# Patient Record
Sex: Male | Born: 1945 | Race: White | Hispanic: No | Marital: Married | Smoking: Never smoker
Health system: Southern US, Community
[De-identification: ages and names within clinical notes are randomized; demographics above are authoritative.]

## PROBLEM LIST (undated history)

## (undated) DIAGNOSIS — C801 Malignant (primary) neoplasm, unspecified: Secondary | ICD-10-CM

## (undated) DIAGNOSIS — M1990 Unspecified osteoarthritis, unspecified site: Secondary | ICD-10-CM

## (undated) DIAGNOSIS — E039 Hypothyroidism, unspecified: Secondary | ICD-10-CM

## (undated) DIAGNOSIS — J45909 Unspecified asthma, uncomplicated: Secondary | ICD-10-CM

## (undated) DIAGNOSIS — C61 Malignant neoplasm of prostate: Secondary | ICD-10-CM

## (undated) DIAGNOSIS — C4490 Unspecified malignant neoplasm of skin, unspecified: Secondary | ICD-10-CM

## (undated) HISTORY — PX: NASAL SEPTUM SURGERY: SHX37

## (undated) HISTORY — DX: Unspecified osteoarthritis, unspecified site: M19.90

## (undated) HISTORY — DX: Unspecified malignant neoplasm of skin, unspecified: C44.90

## (undated) HISTORY — PX: OTHER SURGICAL HISTORY: SHX169

## (undated) HISTORY — DX: Malignant neoplasm of prostate: C61

---

## 1998-11-29 ENCOUNTER — Ambulatory Visit (HOSPITAL_BASED_OUTPATIENT_CLINIC_OR_DEPARTMENT_OTHER): Admission: RE | Admit: 1998-11-29 | Discharge: 1998-11-29 | Payer: Self-pay | Admitting: General Surgery

## 2007-12-16 DIAGNOSIS — C61 Malignant neoplasm of prostate: Secondary | ICD-10-CM

## 2007-12-16 HISTORY — PX: XI ROBOTIC ASSISTED SIMPLE PROSTATECTOMY: SHX6713

## 2007-12-16 HISTORY — DX: Malignant neoplasm of prostate: C61

## 2008-09-28 ENCOUNTER — Inpatient Hospital Stay (HOSPITAL_COMMUNITY): Admission: RE | Admit: 2008-09-28 | Discharge: 2008-09-29 | Payer: Self-pay | Admitting: Urology

## 2008-09-28 ENCOUNTER — Encounter (INDEPENDENT_AMBULATORY_CARE_PROVIDER_SITE_OTHER): Payer: Self-pay | Admitting: Urology

## 2009-06-20 ENCOUNTER — Ambulatory Visit: Payer: Self-pay | Admitting: Oncology

## 2009-06-28 LAB — COMPREHENSIVE METABOLIC PANEL
ALT: 15 U/L (ref 0–53)
CO2: 28 mEq/L (ref 19–32)
Calcium: 9.4 mg/dL (ref 8.4–10.5)
Chloride: 101 mEq/L (ref 96–112)
Creatinine, Ser: 1.19 mg/dL (ref 0.40–1.50)
Glucose, Bld: 91 mg/dL (ref 70–99)
Total Bilirubin: 0.7 mg/dL (ref 0.3–1.2)

## 2009-06-28 LAB — CBC WITH DIFFERENTIAL/PLATELET
BASO%: 0.2 % (ref 0.0–2.0)
EOS%: 5.4 % (ref 0.0–7.0)
Eosinophils Absolute: 0.4 10*3/uL (ref 0.0–0.5)
LYMPH%: 17.9 % (ref 14.0–49.0)
MCH: 32 pg (ref 27.2–33.4)
MCHC: 34.8 g/dL (ref 32.0–36.0)
MCV: 92.2 fL (ref 79.3–98.0)
MONO%: 6.6 % (ref 0.0–14.0)
NEUT#: 5.2 10*3/uL (ref 1.5–6.5)
Platelets: 271 10*3/uL (ref 140–400)
RBC: 4.41 10*6/uL (ref 4.20–5.82)
RDW: 12.9 % (ref 11.0–14.6)

## 2009-06-28 LAB — LACTATE DEHYDROGENASE: LDH: 130 U/L (ref 94–250)

## 2009-06-28 LAB — URIC ACID: Uric Acid, Serum: 6.3 mg/dL (ref 4.0–7.8)

## 2009-06-28 LAB — MORPHOLOGY: RBC Comments: NORMAL

## 2009-07-04 ENCOUNTER — Ambulatory Visit (HOSPITAL_COMMUNITY): Admission: RE | Admit: 2009-07-04 | Discharge: 2009-07-04 | Payer: Self-pay | Admitting: Oncology

## 2009-07-11 ENCOUNTER — Ambulatory Visit: Admission: RE | Admit: 2009-07-11 | Discharge: 2009-08-15 | Payer: Self-pay | Admitting: Radiation Oncology

## 2009-08-02 ENCOUNTER — Ambulatory Visit: Payer: Self-pay | Admitting: Oncology

## 2009-09-28 ENCOUNTER — Ambulatory Visit: Payer: Self-pay | Admitting: Oncology

## 2009-10-27 IMAGING — CR DG CHEST 2V
2 series · 2 of 2 positions shown · non-contrast
Comparison: No priors

CLINICAL DATA: Preop for prostate cancer

CHEST - 2 VIEW

[w chest pa]
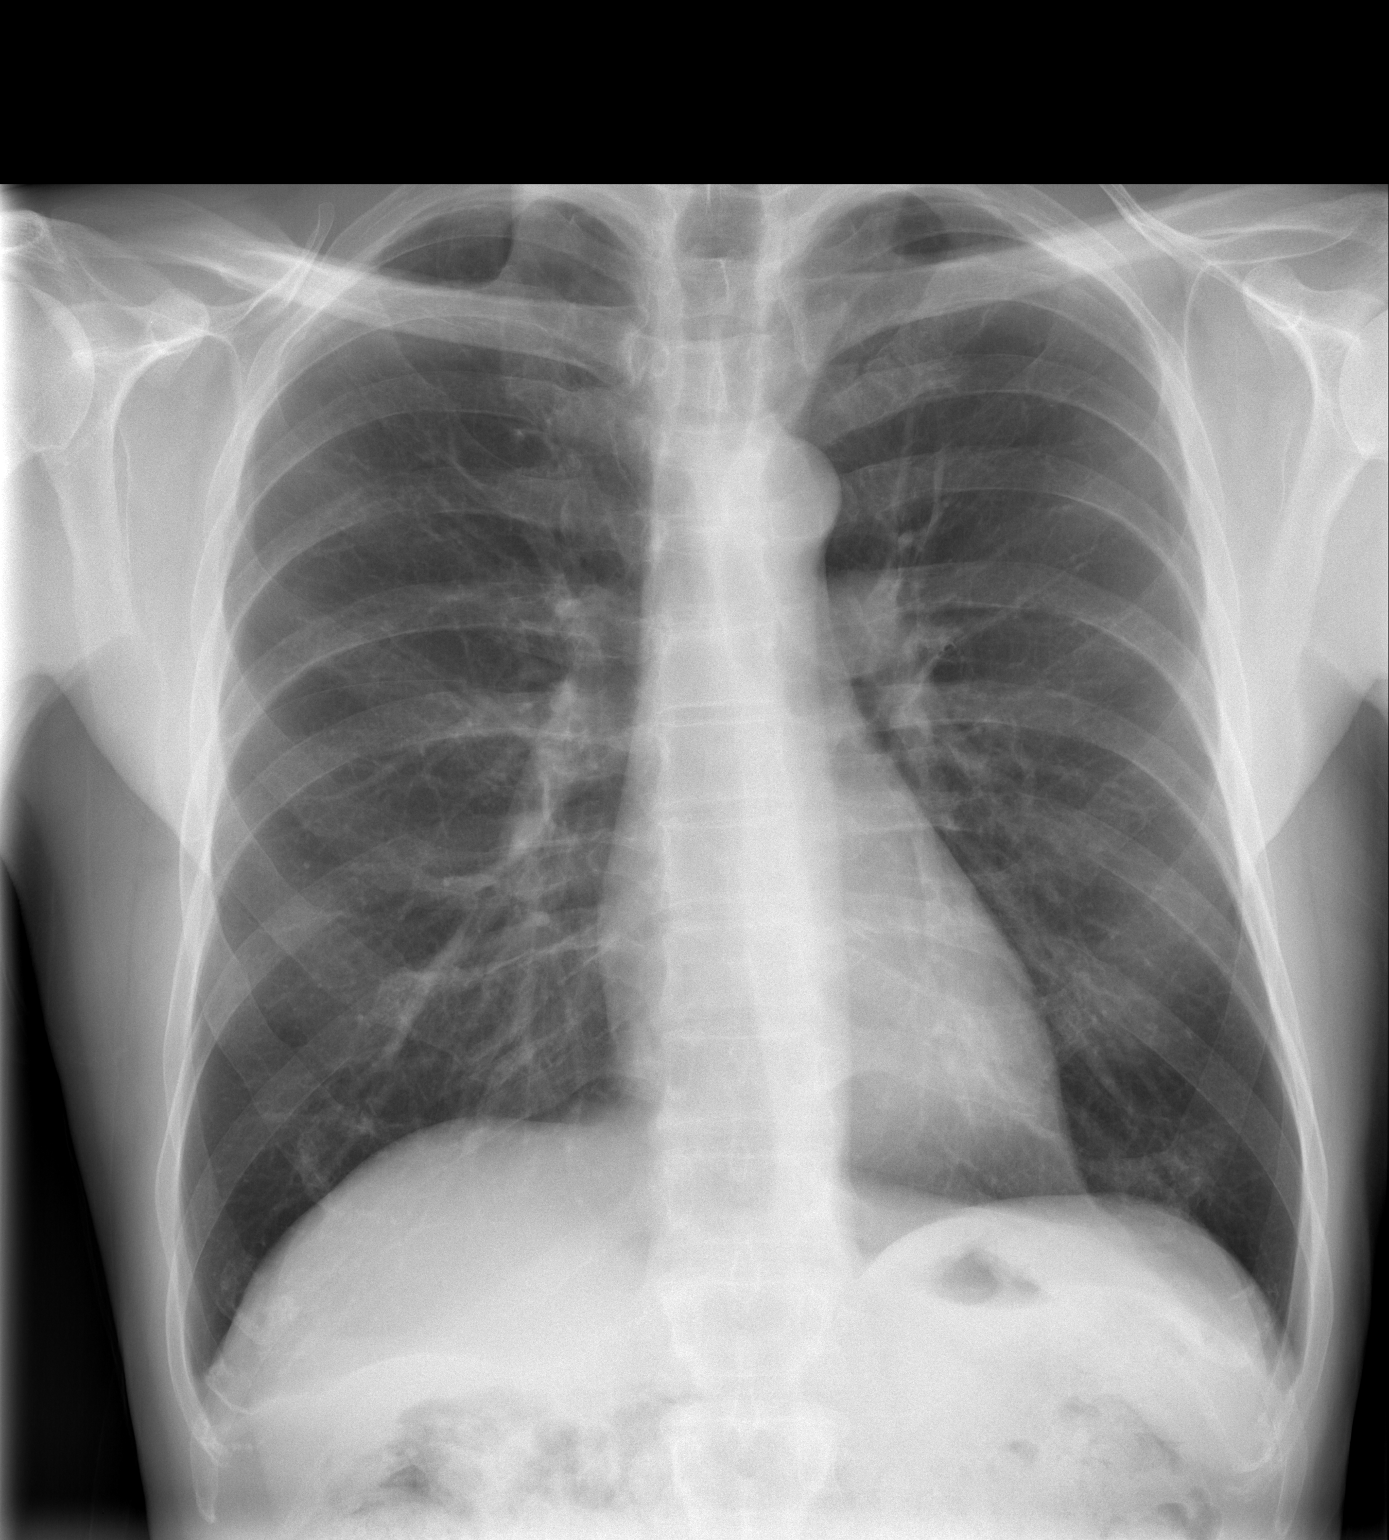

[w chest lat]
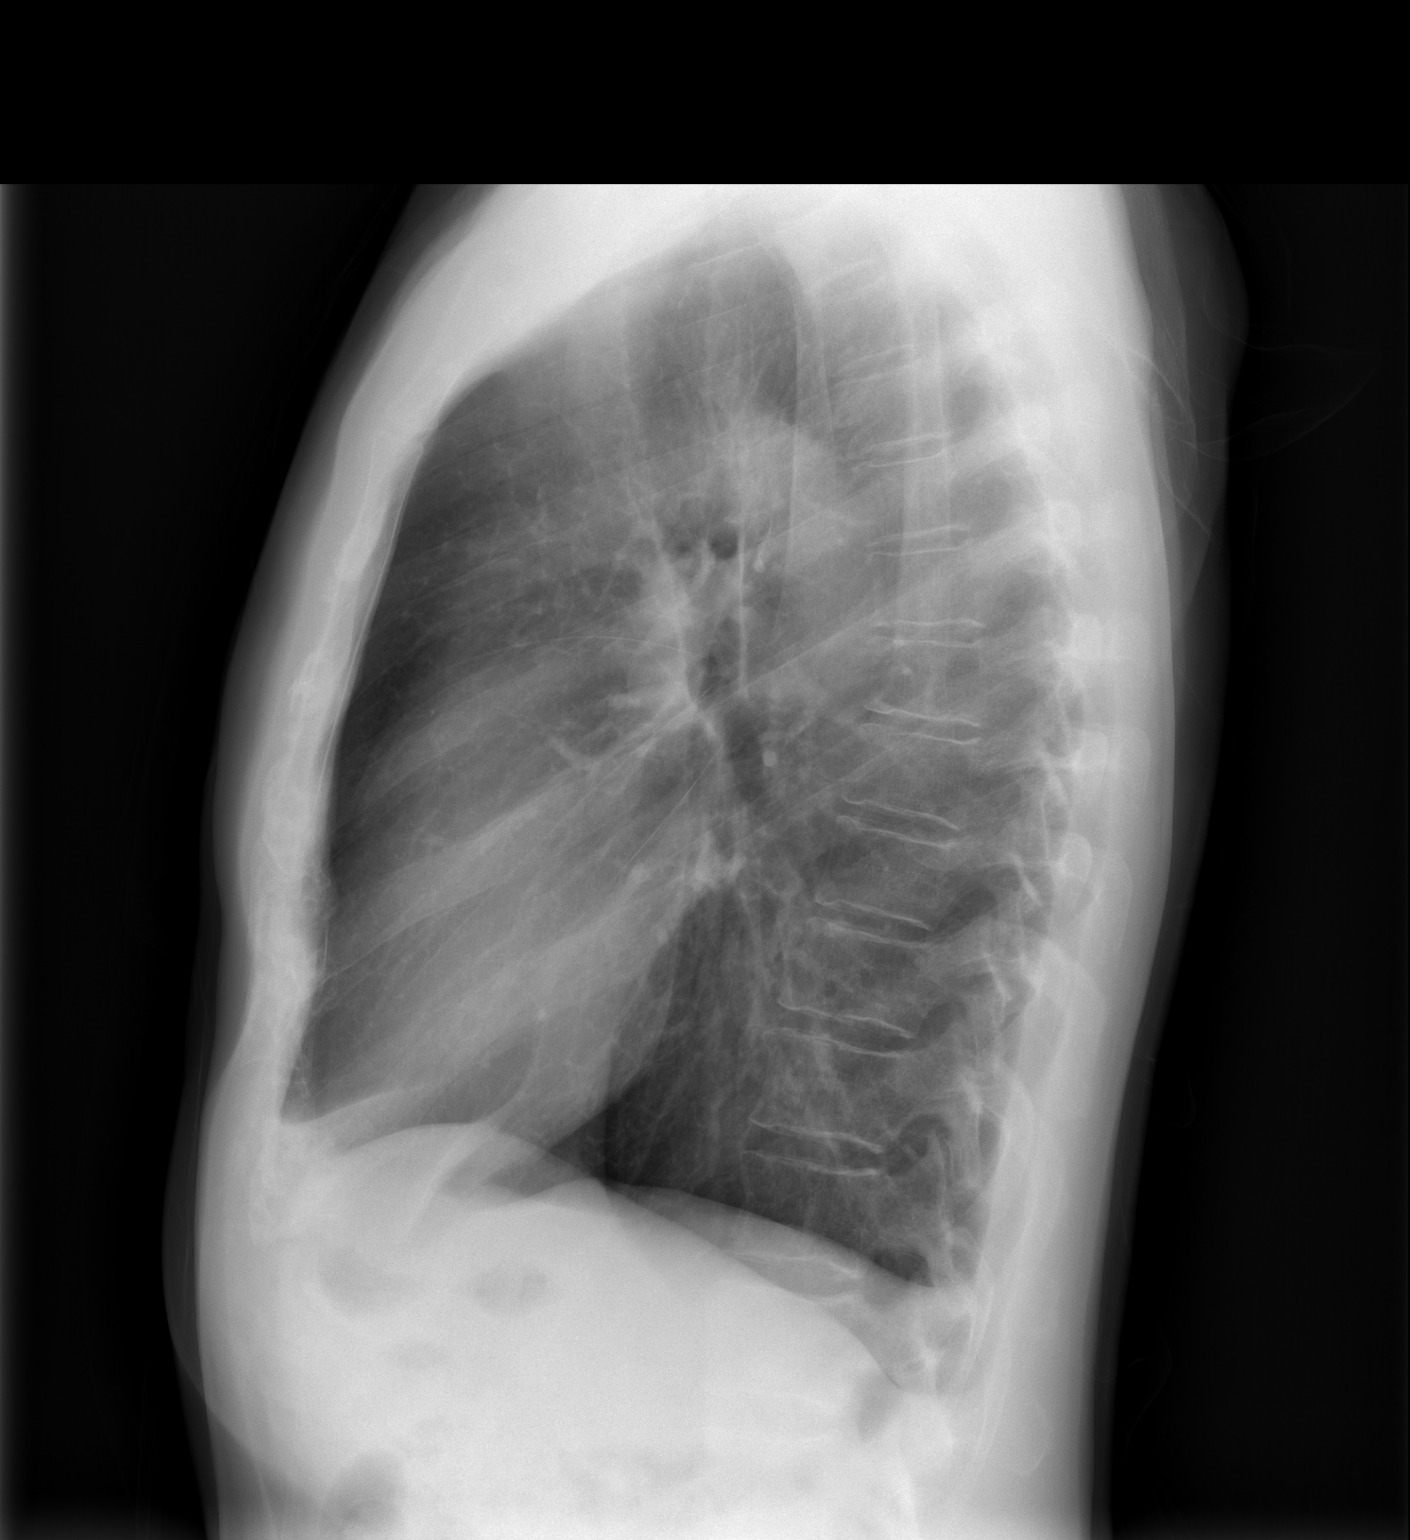

[2 of 2 positions shown; findings below may reference images not displayed]

FINDINGS: Heart and mediastinal contours normal.  Lungs
hyperaerated but clear.  This suggests early COPD.

Osseous structures intact.
IMPRESSION: Probable COPD - no active disease.

## 2010-03-29 ENCOUNTER — Ambulatory Visit: Payer: Self-pay | Admitting: Oncology

## 2010-04-02 LAB — COMPREHENSIVE METABOLIC PANEL
ALT: 14 U/L (ref 0–53)
AST: 22 U/L (ref 0–37)
Albumin: 4 g/dL (ref 3.5–5.2)
Alkaline Phosphatase: 51 U/L (ref 39–117)
BUN: 20 mg/dL (ref 6–23)
CO2: 23 mEq/L (ref 19–32)
Calcium: 8.8 mg/dL (ref 8.4–10.5)
Chloride: 99 mEq/L (ref 96–112)
Creatinine, Ser: 1.1 mg/dL (ref 0.40–1.50)
Glucose, Bld: 94 mg/dL (ref 70–99)
Potassium: 4.6 mEq/L (ref 3.5–5.3)
Sodium: 135 mEq/L (ref 135–145)
Total Bilirubin: 0.4 mg/dL (ref 0.3–1.2)
Total Protein: 6.7 g/dL (ref 6.0–8.3)

## 2010-04-02 LAB — CBC WITH DIFFERENTIAL/PLATELET
BASO%: 0.4 % (ref 0.0–2.0)
Basophils Absolute: 0 10*3/uL (ref 0.0–0.1)
EOS%: 10.9 % — ABNORMAL HIGH (ref 0.0–7.0)
Eosinophils Absolute: 0.7 10*3/uL — ABNORMAL HIGH (ref 0.0–0.5)
HCT: 41.1 % (ref 38.4–49.9)
HGB: 14.1 g/dL (ref 13.0–17.1)
LYMPH%: 21.5 % (ref 14.0–49.0)
MCH: 31.8 pg (ref 27.2–33.4)
MCHC: 34.4 g/dL (ref 32.0–36.0)
MCV: 92.3 fL (ref 79.3–98.0)
MONO#: 0.4 10*3/uL (ref 0.1–0.9)
MONO%: 7.1 % (ref 0.0–14.0)
NEUT#: 3.7 10*3/uL (ref 1.5–6.5)
NEUT%: 60.1 % (ref 39.0–75.0)
Platelets: 254 10*3/uL (ref 140–400)
RBC: 4.46 10*6/uL (ref 4.20–5.82)
RDW: 13.3 % (ref 11.0–14.6)
WBC: 6.2 10*3/uL (ref 4.0–10.3)
lymph#: 1.3 10*3/uL (ref 0.9–3.3)

## 2010-04-02 LAB — LACTATE DEHYDROGENASE: LDH: 136 U/L (ref 94–250)

## 2010-10-01 ENCOUNTER — Ambulatory Visit: Payer: Self-pay | Admitting: Oncology

## 2010-10-03 LAB — COMPREHENSIVE METABOLIC PANEL
ALT: 12 U/L (ref 0–53)
AST: 22 U/L (ref 0–37)
Albumin: 4 g/dL (ref 3.5–5.2)
Alkaline Phosphatase: 52 U/L (ref 39–117)
BUN: 18 mg/dL (ref 6–23)
CO2: 27 mEq/L (ref 19–32)
Calcium: 9.4 mg/dL (ref 8.4–10.5)
Chloride: 102 mEq/L (ref 96–112)
Creatinine, Ser: 1.05 mg/dL (ref 0.40–1.50)
Glucose, Bld: 78 mg/dL (ref 70–99)
Potassium: 4.7 mEq/L (ref 3.5–5.3)
Sodium: 139 mEq/L (ref 135–145)
Total Bilirubin: 0.4 mg/dL (ref 0.3–1.2)
Total Protein: 6.6 g/dL (ref 6.0–8.3)

## 2010-10-03 LAB — CBC WITH DIFFERENTIAL/PLATELET
BASO%: 0.9 % (ref 0.0–2.0)
Basophils Absolute: 0 10*3/uL (ref 0.0–0.1)
EOS%: 7.9 % — ABNORMAL HIGH (ref 0.0–7.0)
Eosinophils Absolute: 0.3 10*3/uL (ref 0.0–0.5)
HCT: 41.4 % (ref 38.4–49.9)
HGB: 14.4 g/dL (ref 13.0–17.1)
LYMPH%: 21.2 % (ref 14.0–49.0)
MCH: 32 pg (ref 27.2–33.4)
MCHC: 34.7 g/dL (ref 32.0–36.0)
MCV: 92.2 fL (ref 79.3–98.0)
MONO#: 0.4 10*3/uL (ref 0.1–0.9)
MONO%: 8.4 % (ref 0.0–14.0)
NEUT#: 2.7 10*3/uL (ref 1.5–6.5)
NEUT%: 61.6 % (ref 39.0–75.0)
Platelets: 245 10*3/uL (ref 140–400)
RBC: 4.49 10*6/uL (ref 4.20–5.82)
RDW: 13.3 % (ref 11.0–14.6)
WBC: 4.4 10*3/uL (ref 4.0–10.3)
lymph#: 0.9 10*3/uL (ref 0.9–3.3)

## 2010-10-03 LAB — LACTATE DEHYDROGENASE: LDH: 140 U/L (ref 94–250)

## 2011-01-04 ENCOUNTER — Other Ambulatory Visit (HOSPITAL_COMMUNITY): Payer: Self-pay | Admitting: Oncology

## 2011-01-04 DIAGNOSIS — C8590 Non-Hodgkin lymphoma, unspecified, unspecified site: Secondary | ICD-10-CM

## 2011-03-16 DIAGNOSIS — C4490 Unspecified malignant neoplasm of skin, unspecified: Secondary | ICD-10-CM

## 2011-03-16 HISTORY — DX: Unspecified malignant neoplasm of skin, unspecified: C44.90

## 2011-04-01 ENCOUNTER — Other Ambulatory Visit (HOSPITAL_COMMUNITY): Payer: Self-pay | Admitting: Oncology

## 2011-04-01 ENCOUNTER — Encounter (HOSPITAL_COMMUNITY): Payer: Self-pay

## 2011-04-01 ENCOUNTER — Encounter (HOSPITAL_BASED_OUTPATIENT_CLINIC_OR_DEPARTMENT_OTHER): Payer: BC Managed Care – PPO | Admitting: Oncology

## 2011-04-01 ENCOUNTER — Ambulatory Visit (HOSPITAL_COMMUNITY)
Admission: RE | Admit: 2011-04-01 | Discharge: 2011-04-01 | Disposition: A | Discharge status: Home / Self Care | Payer: BC Managed Care – PPO | Source: Ambulatory Visit | Attending: Oncology | Admitting: Oncology

## 2011-04-01 ENCOUNTER — Other Ambulatory Visit: Payer: Self-pay | Admitting: Oncology

## 2011-04-01 DIAGNOSIS — C8590 Non-Hodgkin lymphoma, unspecified, unspecified site: Secondary | ICD-10-CM

## 2011-04-01 DIAGNOSIS — C61 Malignant neoplasm of prostate: Secondary | ICD-10-CM

## 2011-04-01 DIAGNOSIS — C8589 Other specified types of non-Hodgkin lymphoma, extranodal and solid organ sites: Secondary | ICD-10-CM | POA: Insufficient documentation

## 2011-04-01 HISTORY — DX: Malignant (primary) neoplasm, unspecified: C80.1

## 2011-04-01 LAB — CBC WITH DIFFERENTIAL/PLATELET
Basophils Absolute: 0 10*3/uL (ref 0.0–0.1)
Eosinophils Absolute: 0.7 10*3/uL — ABNORMAL HIGH (ref 0.0–0.5)
LYMPH%: 26.3 % (ref 14.0–49.0)
MCH: 31.5 pg (ref 27.2–33.4)
MCV: 91.7 fL (ref 79.3–98.0)
MONO%: 8.2 % (ref 0.0–14.0)
NEUT#: 2.3 10*3/uL (ref 1.5–6.5)
Platelets: 260 10*3/uL (ref 140–400)
RBC: 4.67 10*6/uL (ref 4.20–5.82)

## 2011-04-01 LAB — CMP (CANCER CENTER ONLY)
ALT(SGPT): 19 U/L (ref 10–47)
AST: 28 U/L (ref 11–38)
Albumin: 3.6 g/dL (ref 3.3–5.5)
BUN, Bld: 16 mg/dL (ref 7–22)
Calcium: 9.1 mg/dL (ref 8.0–10.3)
Creat: 1.2 mg/dl (ref 0.6–1.2)
Glucose, Bld: 91 mg/dL (ref 73–118)
Sodium: 138 mEq/L (ref 128–145)
Total Protein: 7.1 g/dL (ref 6.4–8.1)

## 2011-04-01 LAB — LACTATE DEHYDROGENASE: LDH: 142 U/L (ref 94–250)

## 2011-04-01 MED ORDER — IOHEXOL 300 MG/ML  SOLN
100.0000 mL | Freq: Once | INTRAMUSCULAR | Status: AC | PRN
  Administered 2011-04-01: 100 mL via INTRAVENOUS

## 2011-04-04 ENCOUNTER — Encounter (HOSPITAL_BASED_OUTPATIENT_CLINIC_OR_DEPARTMENT_OTHER): Payer: BC Managed Care – PPO | Admitting: Oncology

## 2011-04-04 DIAGNOSIS — C8589 Other specified types of non-Hodgkin lymphoma, extranodal and solid organ sites: Secondary | ICD-10-CM

## 2011-04-04 DIAGNOSIS — C61 Malignant neoplasm of prostate: Secondary | ICD-10-CM

## 2011-04-29 NOTE — Op Note (Signed)
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   PROCEDURES:  1. Robotic assisted laparoscopic radical prostatectomy (bilateral      nerve sparing).  2. Bilateral laparoscopic pelvic lymphadenectomy.   SURGEON:  Dr. Heloise Purpura.   ASSISTANT:  Delia Chimes, nurse practitioner.   ANESTHESIA:  General.   COMPLICATIONS:  None.   ESTIMATED BLOOD LOSS:  100 mL.   IV FLUIDS:  2200 mL of lactated Ringer's.   SPECIMENS:  1. Prostate and seminal vesicles.  2. Right pelvic lymph nodes.  3. Left pelvic lymph nodes.   DISPOSITION OF SPECIMENS:  Pathology.   DRAINS:  1. 20-French coude catheter.  2. #19 Blake pelvic drain.   INDICATION:  Mr. Malik Beck is a 65 year old gentleman with clinically  localized adenocarcinoma of prostate.  After discussion regarding  management options for treatment, he elected to proceed with surgical  therapy and the above procedures.  Potential risks, complications, and  alternative options were discussed with the patient in detail and  informed consent was obtained.   DESCRIPTION OF PROCEDURE:  The patient was taken to the operating room  and a general anesthetic was administered.  He was given preoperative  antibiotics, placed in the dorsal lithotomy position, and prepped and  draped in usual sterile fashion.  Next, a preoperative time-out was  performed.  A Foley catheter was inserted into the bladder and a site  was selected just  superior to the umbilicus for placement of the camera  port.  This was placed using a standard open Hassan technique which  allowed entry into the peritoneal cavity under direct vision and without  difficulty.  A 12 mm port was then placed and a pneumoperitoneum  established.  The 0 degrees lens was used to inspect the abdomen and  there was no evidence for any intra-abdominal injuries or other  abnormalities.  The remaining ports were then placed.  Bilateral 8 mm  robotic ports were placed 10 cm lateral to and just inferior to the  camera port site.  An additional 8 mm robotic port was placed in the far  left lateral abdominal wall.  A 5 mm port was placed between the camera  port and the right robotic port.  An additional 12 mm port was placed in  the far right lateral abdominal wall for laparoscopic assistance.  All  ports were placed under direct vision and without difficulty.  The  surgical cart was then docked.  With the aid of the cautery scissors,  the bladder was reflected posteriorly allowing entrance to the space of  Retzius and identification of the endopelvic fascia and prostate.  The  endopelvic fascia was incised from the apex back to the base of the  prostate and the underlying levator muscle fibers were swept laterally  off the prostate thereby isolating the dorsal venous complex which was  then stapled and divided with a 45 mm Flex ETS stapler.  The bladder  neck was then identified with the aid of Foley catheter manipulation,  was divided anteriorly thereby exposing the Foley catheter.  The  catheter balloon was deflated and the catheter was brought into the  operative field and used to retract the prostate anteriorly.  The  posterior bladder neck was then divided and dissection proceeded between  the bladder neck and prostate until the vasa deferentia and seminal  vesicles were identified.  The vasa deferentia were isolated, divided  and lifted anteriorly.  The seminal  vesicles were then dissected down to  their tips with care to control the seminal vesicle arterial blood  supply with Hem-o-lok clips.  The seminal vesicles were then lifted  anteriorly and the space between Denonvilliers fascia and the anterior  rectum was bluntly developed thereby isolating the vascular pedicles of  the prostate.  The lateral prostatic fascia was then incised sharply  allowing the neurovascular bundles to be released bilaterally.  On the  right side, a more aggressive nerve sparing procedure was performed and  the left where some periprosthetic tissue was left on the prostate due  to the larger volume disease on this side of the prostate per his biopsy  report.  The vascular pedicles of the prostate were then ligated with  Hem-o-lok clips above the level of the neurovascular bundles and divided  with sharp cold scissor dissection.  The neurovascular bundles were then  swept off the apex of the prostate and urethra and the urethra was  sharply divided allowing the prostate specimen to be disarticulated.  The pelvis was then copiously irrigated and hemostasis was ensured.  There was no evidence for a rectal injury.  Attention then turned to the  right pelvic sidewall.  The fibrofatty tissue between the external iliac  vein, confluence of the iliac vessels, hypogastric artery, and Cooper's  ligament was dissected free from the pelvic sidewall with care to  preserve the obturator nerve.  Hem-o-lok clips were used for  lymphostasis and hemostasis.  An identical procedure was then performed  on the contralateral side.  Both specimens were removed for permanent  pathologic analysis.  The urethral anastomosis was then performed.  A 2-  0 Vicryl slip-knot was placed between Denonvilliers fascia, the  posterior bladder neck and the posterior urethra to reapproximate these  structures.  A double-armed 3-0 Monocryl suture was used to perform a  360 degrees running tension-free  anastomosis between the bladder neck  and urethra.  A new 20-French coude catheter was then inserted into the  bladder and irrigated.  There were no blood clots within the bladder and  the anastomosis appeared be watertight.  A #19 Blake drain was then  brought through the left robotic port and appropriately positioned in  the pelvis.  It was secured to the skin with a nylon suture.  The  surgical cart was undocked and the right lateral 12 mm port site was  closed with a 0 Vicryl fascial suture placed with the aid of suture  passer device.  All remaining ports were removed under direct vision and  the prostate specimen was removed intact within the  Endopouch retrieval  bag via the periumbilical port site.  This fascial opening was closed  with a running 0 Vicryl  suture.  All ports were injected with 0.25% Marcaine and reapproximated  at the skin level with staples.  Sterile dressings were applied.  The  patient appeared to tolerate the procedure well without complications.  He was able to be extubated and transferred to recovery unit in  satisfactory condition.      Heloise Purpura, MD  Electronically Signed     LB/MEDQ  D:  09/28/2008  T:  09/28/2008  Job:  [id number]

## 2011-05-16 ENCOUNTER — Other Ambulatory Visit: Payer: Self-pay

## 2011-06-04 ENCOUNTER — Other Ambulatory Visit: Payer: Self-pay | Admitting: Dermatology

## 2011-09-16 LAB — BASIC METABOLIC PANEL
BUN: 13
Creatinine, Ser: 1.09
GFR calc Af Amer: >60
GFR calc non Af Amer: >60
Potassium: 4.1

## 2011-09-16 LAB — ABO/RH: ABO/RH(D): A POS

## 2011-09-16 LAB — CBC
HCT: 44.4
Platelets: 258
RBC: 4.77
WBC: 5.7

## 2011-09-16 LAB — TYPE AND SCREEN: ABO/RH(D): A POS

## 2011-09-16 LAB — HEMOGLOBIN AND HEMATOCRIT, BLOOD: Hemoglobin: 14.7

## 2011-10-17 ENCOUNTER — Encounter (HOSPITAL_BASED_OUTPATIENT_CLINIC_OR_DEPARTMENT_OTHER): Payer: BC Managed Care – PPO | Admitting: Oncology

## 2011-10-17 ENCOUNTER — Other Ambulatory Visit (HOSPITAL_COMMUNITY): Payer: Self-pay | Admitting: Oncology

## 2011-10-17 DIAGNOSIS — C8589 Other specified types of non-Hodgkin lymphoma, extranodal and solid organ sites: Secondary | ICD-10-CM

## 2011-10-17 DIAGNOSIS — C61 Malignant neoplasm of prostate: Secondary | ICD-10-CM

## 2011-10-17 DIAGNOSIS — C8588 Other specified types of non-Hodgkin lymphoma, lymph nodes of multiple sites: Secondary | ICD-10-CM

## 2011-10-17 LAB — CBC WITH DIFFERENTIAL/PLATELET
Basophils Absolute: 0 10*3/uL (ref 0.0–0.1)
EOS%: 12.9 % — ABNORMAL HIGH (ref 0.0–7.0)
HCT: 45.1 % (ref 38.4–49.9)
HGB: 15.6 g/dL (ref 13.0–17.1)
LYMPH%: 19.4 % (ref 14.0–49.0)
MCH: 31.9 pg (ref 27.2–33.4)
MCV: 92.5 fL (ref 79.3–98.0)
MONO%: 7.7 % (ref 0.0–14.0)
NEUT%: 59.6 % (ref 39.0–75.0)
Platelets: 277 10*3/uL (ref 140–400)

## 2011-10-17 LAB — COMPREHENSIVE METABOLIC PANEL
AST: 27 U/L (ref 0–37)
Alkaline Phosphatase: 55 U/L (ref 39–117)
BUN: 15 mg/dL (ref 6–23)
Calcium: 9.7 mg/dL (ref 8.4–10.5)
Chloride: 103 mEq/L (ref 96–112)
Creatinine, Ser: 1.14 mg/dL (ref 0.50–1.35)
Total Bilirubin: 0.4 mg/dL (ref 0.3–1.2)

## 2011-10-20 ENCOUNTER — Telehealth: Payer: Self-pay | Admitting: Medical Oncology

## 2011-10-20 NOTE — Telephone Encounter (Signed)
I called pt to let him know his PSA was 0.02 on 10/17/11

## 2011-10-20 NOTE — Progress Notes (Signed)
CC:   Pearla Dubonnet, M.D. Maryln Gottron, M.D. Frederick A. Worthy Rancher, M.D. 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  Mr. Marinello was last seen by [place] on 04/04/2011.  It will be recalled that the patient received radiation treatments, 4050 cGy in 18 fractions, from 07/18/2009 through 08/10/2009 under the direction Dr. Chipper Herb.  The patient was seen in consultation by Dr. Elta Guadeloupe.  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  Chemotherapy was not recommended at this time.  The patient also has a diagnosis of adenocarcinoma of the prostate, T3a with focal capsular extension and focal involvement of the apex.  The patient underwent robotic-assisted laparoscopic radical prostatectomy on 09/28/2008.  Gleason score was 3+4 = 7 with 0/14 lymph nodes positive. Margins were free, as were the seminal vesicles.  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  The patient underwent wide excision.  I have asked Mr. Hopfensperger to try to obtain the pathology report for [place].  This was carried out by Dr. Terri Piedra.  The patient continues to do well.  He is without any complaints today. He remains quite physically fit and active, doing a lot of bike riding, 50-100 miles per week, playing tennis and indulging in regular exercise. He feels entirely well and has no complaints today.  MEDICINES:  Reviewed and recorded.  He is on aspirin, Synthroid, Plaquenil, multivitamins and Advil.  PAST MEDICAL HISTORY:  He does have rheumatoid arthritis, and hypothyroidism.  PHYSICAL EXAMINATION:  He continues to look quite fit and well.  He is lean and muscular.  Weight is 141 pounds, height 68-1/2 inches.  He is 65 years old.  Blood  pressure 146/84.  Other vital signs are normal. There is no scleral icterus.  Mouth and pharynx are benign.  No peripheral adenopathy palpable.  No adenopathy in the neck, supraclavicular, axillary or inguinal areas.  Heart and lungs are normal.  Back area is well healed with no evidence for recurrent lymphoma.  No other suspicious sites.  Abdomen:  Benign with no organomegaly or masses palpable.  Extremities: No peripheral edema or clubbing.  He has a scar on his right lateral thigh but that has no suspicious findings.  No induration, looks entirely benign.  LABORATORY DATA:  Today, white count 5.0, ANC 3.0 hemoglobin 15.6, hematocrit 45.1, platelets 277,000.  Chemistries are pending. Chemistries from 04/17 and 10/20 were normal.  PSA from 04/17 was 0.01. Mr. Olmo did have a chest x-ray on 04/17 that was negative.  He also had CT scan of the abdomen and pelvis with IV and oral contrast also carried out on 04/01/2011.  There was no CT evidence for lymphoma or any adenopathy.  He did have degenerative changes in the spine and scattered atherosclerosis with aortic calcifications without aneurysm.  IMPRESSION AND PLAN:  Mr. Graham continues to do well, now out 2-1/2 years from the time of diagnosis of his non-Hodgkin's lymphoma.  We will plan to see him again in 6 months at which time he can see either myself or the mid level.  We will check CBC, chemistries, and a PSA.  Imaging studies will be done with discretion in attempt to limit radiation exposure.    ______________________________ Gerarda Fraction  Bradly Sangiovanni, M.D. DSM/MEDQ  D:  10/17/2011  T:  10/20/2011  Job:  [id number]

## 2011-10-20 NOTE — Telephone Encounter (Signed)
Pt left a messege requesting his PSA results from 110/02/12

## 2012-04-07 ENCOUNTER — Telehealth: Payer: Self-pay | Admitting: Oncology

## 2012-04-07 NOTE — Telephone Encounter (Signed)
called pt and moved 5/3 appt from 9 to 11   aom

## 2012-04-16 ENCOUNTER — Ambulatory Visit (HOSPITAL_BASED_OUTPATIENT_CLINIC_OR_DEPARTMENT_OTHER): Payer: BC Managed Care – PPO | Admitting: Oncology

## 2012-04-16 ENCOUNTER — Encounter: Payer: Self-pay | Admitting: Oncology

## 2012-04-16 ENCOUNTER — Other Ambulatory Visit: Payer: BC Managed Care – PPO | Admitting: Lab

## 2012-04-16 ENCOUNTER — Other Ambulatory Visit (HOSPITAL_BASED_OUTPATIENT_CLINIC_OR_DEPARTMENT_OTHER): Payer: BC Managed Care – PPO | Admitting: Lab

## 2012-04-16 ENCOUNTER — Ambulatory Visit: Payer: BC Managed Care – PPO | Admitting: Oncology

## 2012-04-16 VITALS — BP 160/80 | HR 56 | Temp 96.8°F | Ht 68.5 in | Wt 143.6 lb

## 2012-04-16 DIAGNOSIS — C61 Malignant neoplasm of prostate: Secondary | ICD-10-CM | POA: Insufficient documentation

## 2012-04-16 DIAGNOSIS — C8589 Other specified types of non-Hodgkin lymphoma, extranodal and solid organ sites: Secondary | ICD-10-CM

## 2012-04-16 DIAGNOSIS — C4370 Malignant melanoma of unspecified lower limb, including hip: Secondary | ICD-10-CM

## 2012-04-16 DIAGNOSIS — C8599 Non-Hodgkin lymphoma, unspecified, extranodal and solid organ sites: Secondary | ICD-10-CM | POA: Insufficient documentation

## 2012-04-16 DIAGNOSIS — D0371 Melanoma in situ of right lower limb, including hip: Secondary | ICD-10-CM | POA: Insufficient documentation

## 2012-04-16 LAB — CBC WITH DIFFERENTIAL/PLATELET
Eosinophils Absolute: 0.6 10*3/uL — ABNORMAL HIGH (ref 0.0–0.5)
LYMPH%: 21.5 % (ref 14.0–49.0)
MONO#: 0.5 10*3/uL (ref 0.1–0.9)
NEUT#: 3.2 10*3/uL (ref 1.5–6.5)
Platelets: 268 10*3/uL (ref 140–400)
RBC: 4.76 10*6/uL (ref 4.20–5.82)
WBC: 5.5 10*3/uL (ref 4.0–10.3)
nRBC: 0 % (ref 0–0)

## 2012-04-16 LAB — COMPREHENSIVE METABOLIC PANEL
AST: 28 U/L (ref 0–37)
Albumin: 4.3 g/dL (ref 3.5–5.2)
Alkaline Phosphatase: 56 U/L (ref 39–117)
Potassium: 4.5 mEq/L (ref 3.5–5.3)
Sodium: 136 mEq/L (ref 135–145)
Total Bilirubin: 0.4 mg/dL (ref 0.3–1.2)
Total Protein: 6.9 g/dL (ref 6.0–8.3)

## 2012-04-16 NOTE — Progress Notes (Signed)
CC:   Pearla Dubonnet, M.D. Maryln Gottron, M.D. Frederick A. Worthy Rancher, M.D. Heloise Purpura, MD  PROBLEM LIST: 1. 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 2. Adenocarcinoma of the prostate T1c, Gleason's grade 3 + 4 = 7.  The     patient underwent a robotic-assisted laparoscopic radical     prostatectomy (bilateral nerve sparing) on 09/28/2008.  All 14     lymph nodes were negative.  Pathologic stage was T3a N0 M0.  There     was bilateral involvement, focal extracapsular extension and     perineural invasion.  There was no involvement of the seminal     vesicles and the margins were clear.  The patient is on observation     at this time. 3. 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 Para Skeans on     06/04/2011.  There was no melanoma in the resected specimen of June 04, 2011. 4. Rheumatoid arthritis diagnosed in 1998. 5. Hypothyroidism diagnosed 1996.  MEDICATIONS: 1. Aspirin 81 mg daily. 2. Plaquenil 200 mg twice daily. 3. Levothyroxine 125 mg daily. 4. Multivitamins daily.  HISTORY:  Malik Beck was seen today for followup of his non- Hodgkin's lymphoma, B-cell type, involving the dermis of the mid back. Malik Beck was last seen by [place] on 10/17/2011.  As stated, he did receive radiation treatments to the lesion after it had been resected.  The patient was seen in consultation by Dr. Elta Guadeloupe at [facility].  No chemotherapy was recommended and the patient has done well, now approaching 3 years from the time of diagnosis.  He has no complaints, feels quite well.  He is a cyclist and is out on his bike almost every day, usually cycling about 20-25 miles per day.  He is in superb condition.  As stated, he is without any  complaints today.  PHYSICAL EXAM:  General:  He looks well.  Vital signs:  Weight is 143.6 pounds, height 5 feet 8 1/2 inches, body surface area 1.77 meters squared.  Blood pressure 160/80.  Other vital signs are normal.  The patient is 66 years old.  HEENT:  There is no scleral icterus.  Mouth and pharynx are benign.  No peripheral adenopathy palpable in any of the lymph node regions.  Heart and lungs:  Normal.  Back:  Area is well healed with no evidence of recurrent lymphoma.  No suspicious skin lesions.  Abdomen:  Benign with no organomegaly or masses palpable. Extremities:  No peripheral edema or clubbing.  He has a scar on his right lateral thigh but again no suspicious areas.  Neurologic:  Exam is normal.  LABORATORY DATA:  Today, white count 5.5, ANC 3.2, hemoglobin 15.0, hematocrit 44.0, platelets 268,000.  Chemistries today were entirely normal including an albumin of 4.3, LDH 148.  PSA is pending.  PSA from 10/17/2011 was 0.02.  IMAGING STUDIES: 1. PET scan from 07/04/2009 was negative for lymphoma. 2. CT scan of abdomen and pelvis with IV contrast on 04/01/2011 was     negative for lymphoma recurrence. 3. Chest x-ray, 2 views, from 04/01/2011 showed stable hyperinflation     with no  active disease.  IMPRESSION AND PLAN:  Malik Beck continues to do well now approximately 3 years from the time of diagnosis of his non-Hodgkin's lymphoma.  He is being seen on a regular basis by his dermatologist, Dr. Para Skeans, by his urologist, Dr. Heloise Purpura, and by his primary care physician, Dr. Pearla Dubonnet.  The patient would like to see [place] again in 1 year. He has asked [place] to check his labs for him which include PSA, possibly a thyroid test.  Accordingly, we will plan to see Malik Beck again in 1 year at which time we will check CBC, chemistries, and PSA.    ______________________________ Samul Dada, M.D. DSM/MEDQ  D:  04/16/2012  T:  04/16/2012  Job:  [id number]

## 2012-04-16 NOTE — Progress Notes (Signed)
This office note has been dictated.  #[id number]

## 2012-04-19 ENCOUNTER — Telehealth: Payer: Self-pay | Admitting: Oncology

## 2012-04-19 NOTE — Telephone Encounter (Signed)
l/m with 04/15/13 appt info     aom

## 2012-05-01 IMAGING — CR DG CHEST 2V
2 series · 2 of 2 positions shown · non-contrast
Comparison: 09/26/2008

CLINICAL DATA: Non-Hodgkins lymphoma.

CHEST - 2 VIEW

[w chest pa]
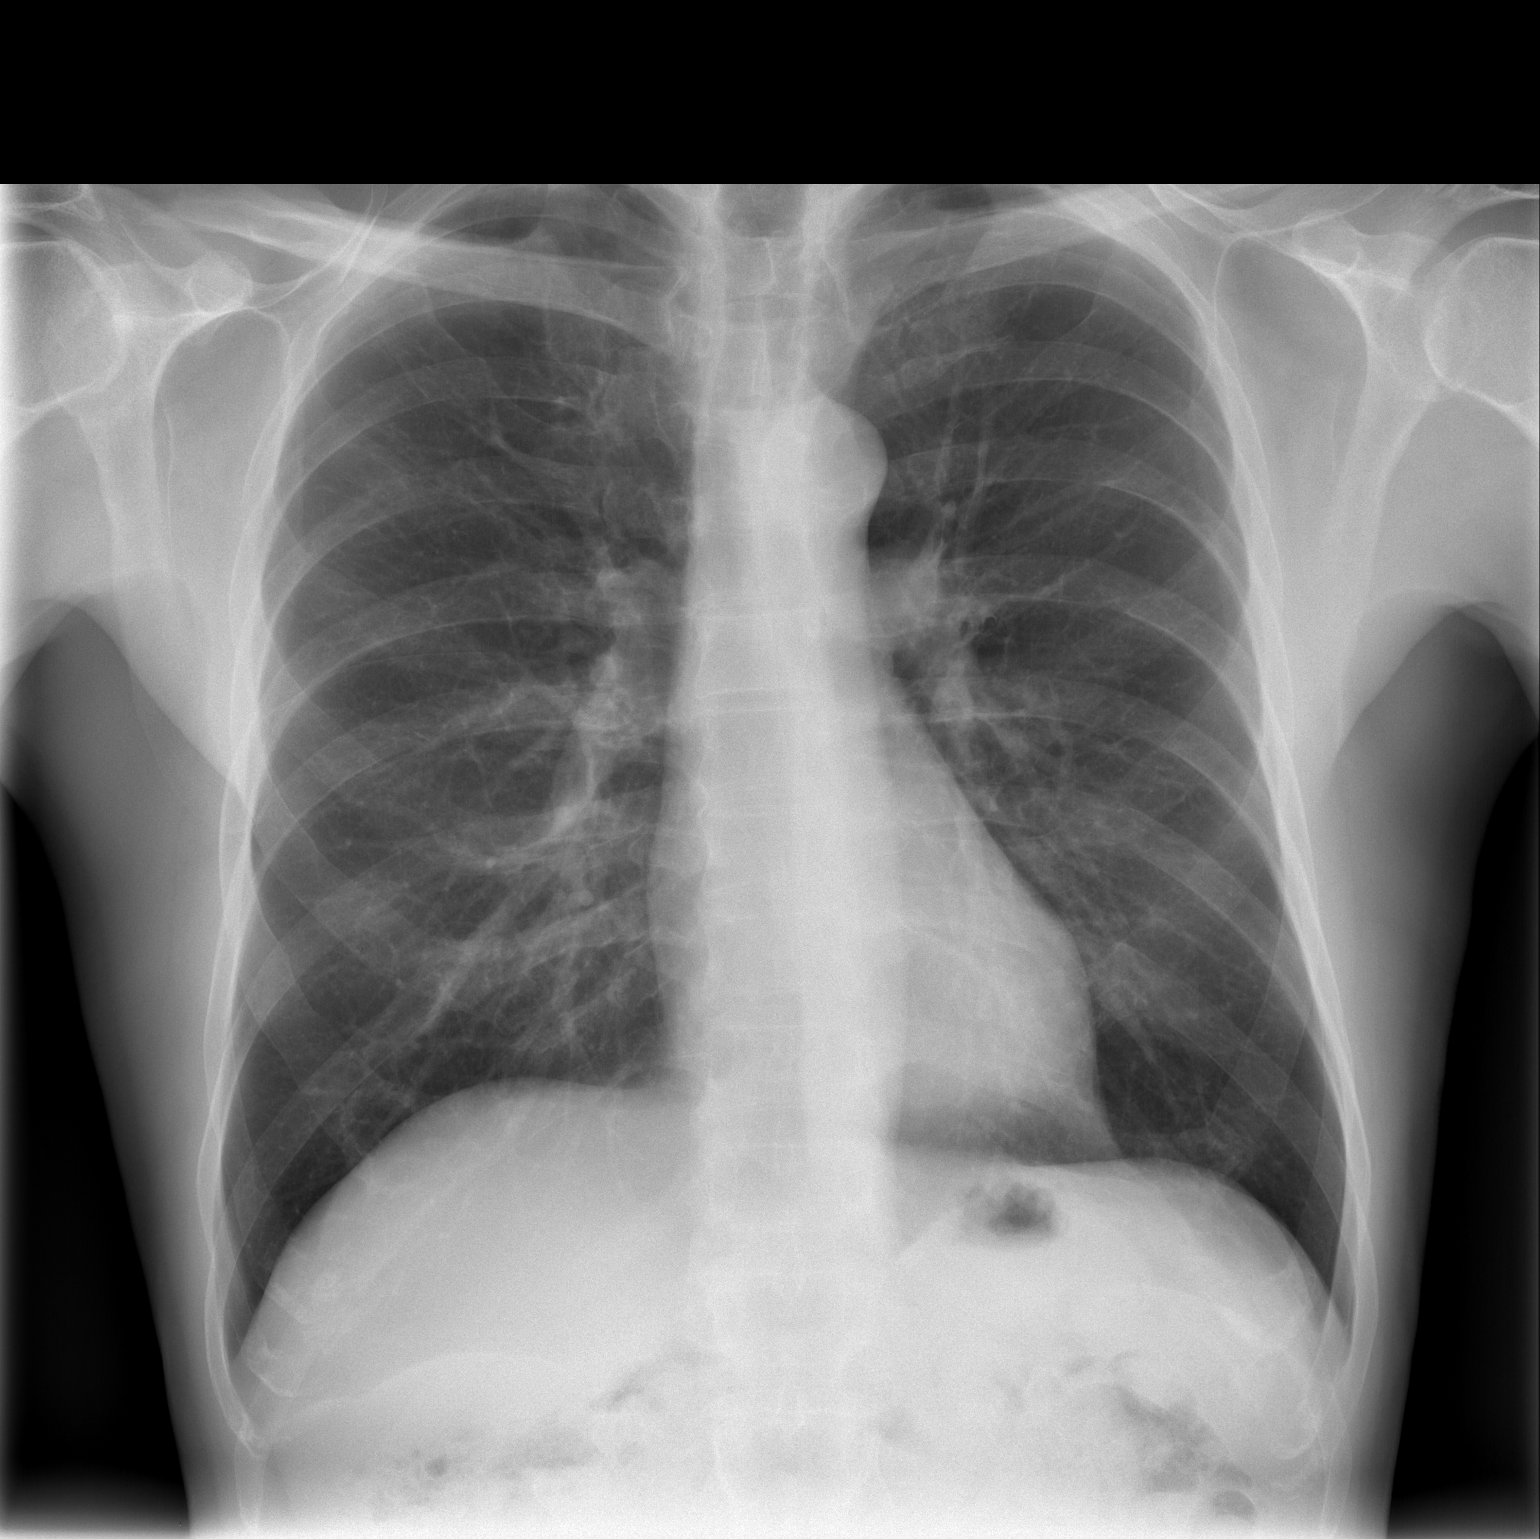

[w chest lat]
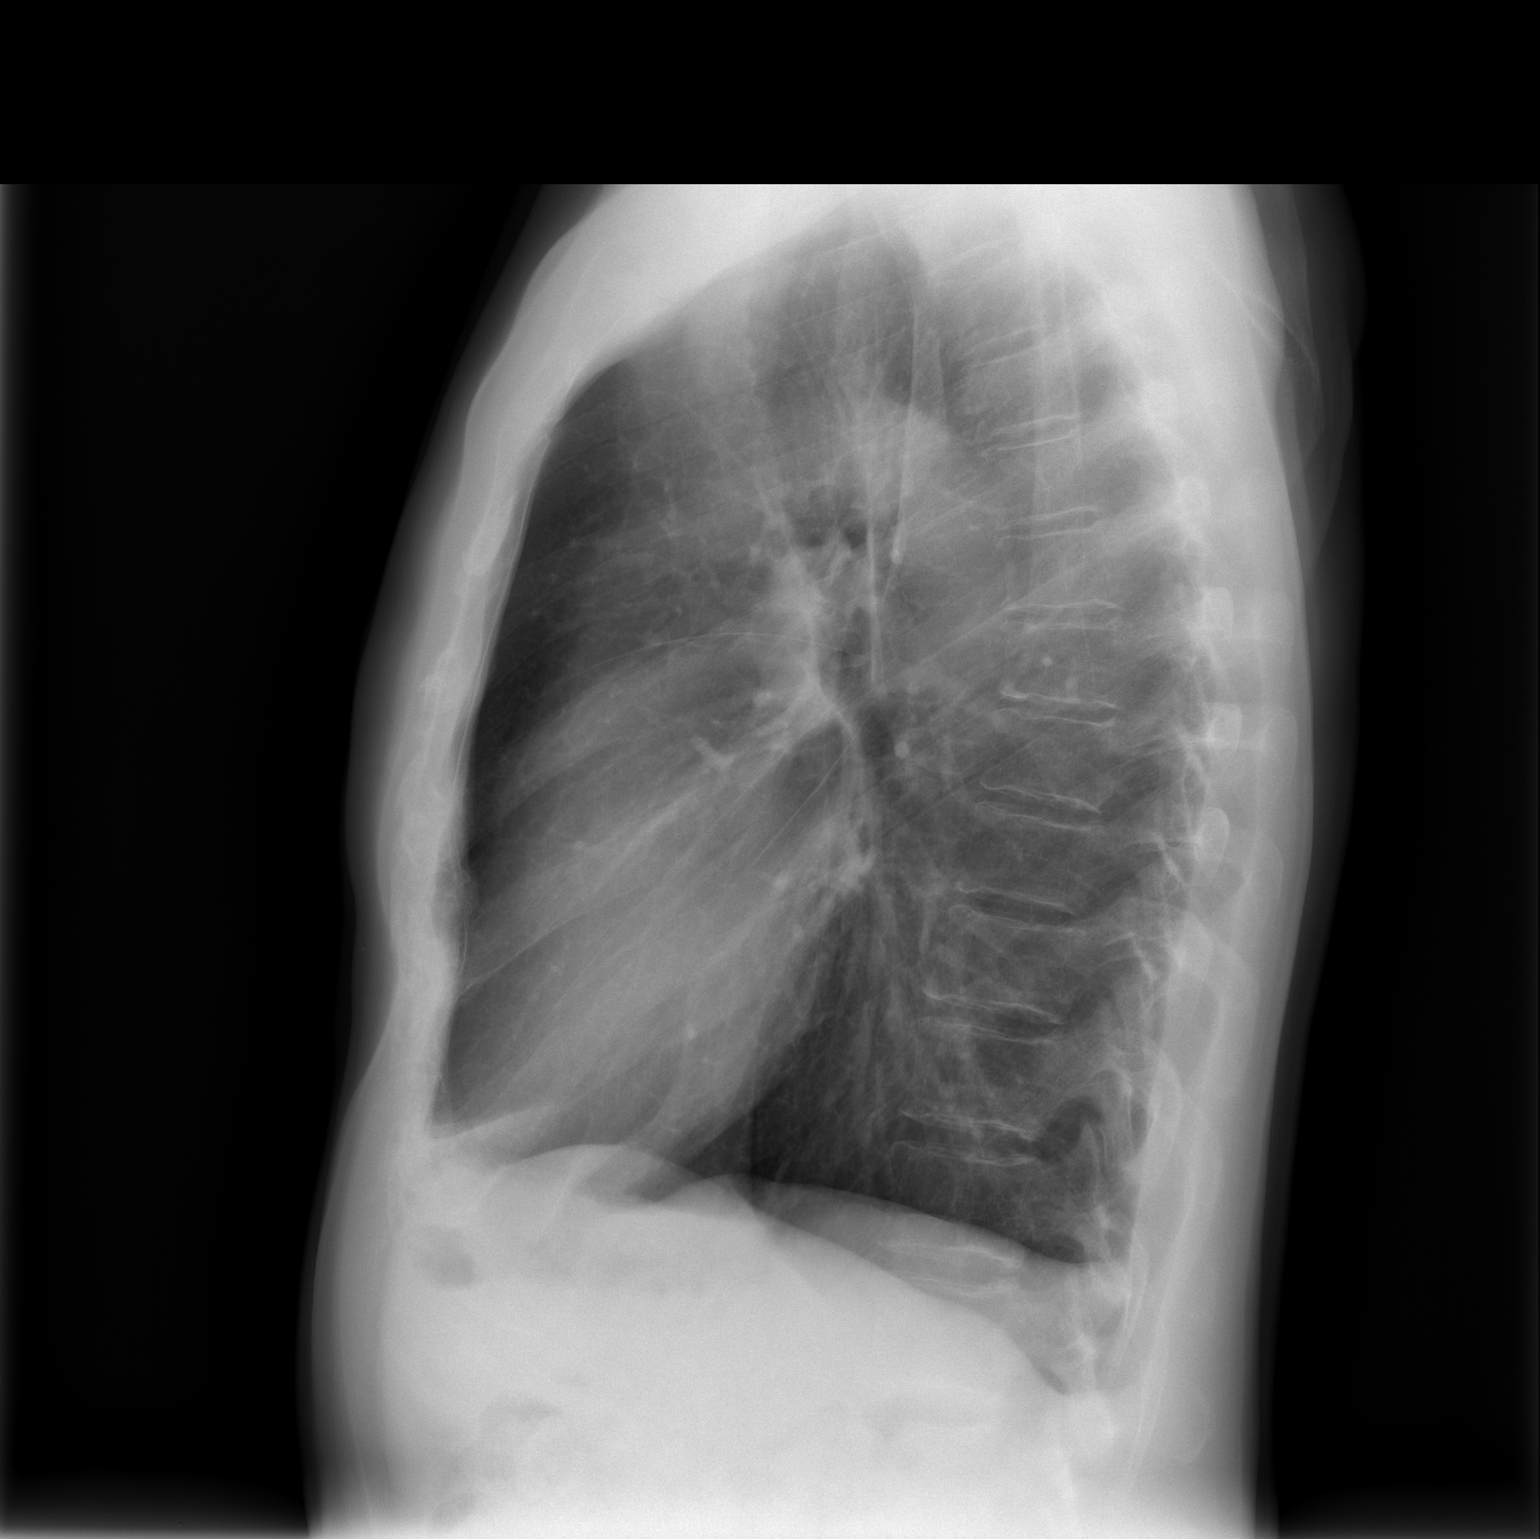

[2 of 2 positions shown; findings below may reference images not displayed]

FINDINGS: Stable mild hyperinflation. Heart and mediastinal
contours are within normal limits.  No focal opacities or
effusions.  No acute bony abnormality.
IMPRESSION: Stable hyperinflation.  No active disease.

## 2013-03-25 ENCOUNTER — Telehealth: Payer: Self-pay | Admitting: Oncology

## 2013-03-25 NOTE — Telephone Encounter (Signed)
lvm for pt regarding to change in appt time on 5.2.14 to 2:30pm..Marland Kitchen

## 2013-03-25 NOTE — Telephone Encounter (Signed)
mailed pt appt schedule and r/s letter

## 2013-04-11 ENCOUNTER — Telehealth: Payer: Self-pay | Admitting: Oncology

## 2013-04-11 NOTE — Telephone Encounter (Signed)
Per DM/desk nurse 5/2 appt moved to 4/29 due to PM PAL. lmonvm for pt and asked that pt call me if he cannot keep appt.

## 2013-04-12 ENCOUNTER — Ambulatory Visit (HOSPITAL_BASED_OUTPATIENT_CLINIC_OR_DEPARTMENT_OTHER): Payer: BC Managed Care – PPO | Admitting: Lab

## 2013-04-12 ENCOUNTER — Encounter: Payer: Self-pay | Admitting: Oncology

## 2013-04-12 ENCOUNTER — Other Ambulatory Visit: Payer: Self-pay

## 2013-04-12 ENCOUNTER — Ambulatory Visit (HOSPITAL_BASED_OUTPATIENT_CLINIC_OR_DEPARTMENT_OTHER): Payer: BC Managed Care – PPO | Admitting: Oncology

## 2013-04-12 VITALS — BP 162/71 | HR 93 | Temp 97.3°F | Resp 18 | Ht 68.5 in | Wt 149.6 lb

## 2013-04-12 DIAGNOSIS — C8599 Non-Hodgkin lymphoma, unspecified, extranodal and solid organ sites: Secondary | ICD-10-CM

## 2013-04-12 DIAGNOSIS — C8589 Other specified types of non-Hodgkin lymphoma, extranodal and solid organ sites: Secondary | ICD-10-CM

## 2013-04-12 DIAGNOSIS — D0371 Melanoma in situ of right lower limb, including hip: Secondary | ICD-10-CM

## 2013-04-12 DIAGNOSIS — C8588 Other specified types of non-Hodgkin lymphoma, lymph nodes of multiple sites: Secondary | ICD-10-CM

## 2013-04-12 LAB — CBC WITH DIFFERENTIAL/PLATELET
BASO%: 0.8 % (ref 0.0–2.0)
EOS%: 12.3 % — ABNORMAL HIGH (ref 0.0–7.0)
MCH: 30.8 pg (ref 27.2–33.4)
MCHC: 33.3 g/dL (ref 32.0–36.0)
MONO#: 0.6 10*3/uL (ref 0.1–0.9)
RDW: 13.1 % (ref 11.0–14.6)
WBC: 6.2 10*3/uL (ref 4.0–10.3)
lymph#: 1.6 10*3/uL (ref 0.9–3.3)

## 2013-04-12 LAB — COMPREHENSIVE METABOLIC PANEL (CC13)
ALT: 13 U/L (ref 0–55)
AST: 23 U/L (ref 5–34)
Albumin: 3.8 g/dL (ref 3.5–5.0)
Alkaline Phosphatase: 61 U/L (ref 40–150)
Potassium: 4.4 mEq/L (ref 3.5–5.1)
Sodium: 136 mEq/L (ref 136–145)
Total Protein: 7.4 g/dL (ref 6.4–8.3)

## 2013-04-12 LAB — PSA: PSA: 0.03 ng/mL (ref <=4.00)

## 2013-04-12 NOTE — Progress Notes (Signed)
This office note has been dictated.  #[id number]

## 2013-04-13 ENCOUNTER — Telehealth: Payer: Self-pay

## 2013-04-13 NOTE — Progress Notes (Signed)
CC:   Pearla Dubonnet, M.D. Maryln Gottron, M.D. Frederick A. Worthy Rancher, M.D. Heloise Purpura, MD  PROBLEM LIST:  1. 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  2. Adenocarcinoma of the prostate T1c, Gleason's grade 3 + 4 = 7. The  patient underwent a robotic-assisted laparoscopic radical  prostatectomy (bilateral nerve sparing) on 09/28/2008. All 14  lymph nodes were negative. Pathologic stage was T3a N0 M0. There  was bilateral involvement, focal extracapsular extension and  perineural invasion. There was no involvement of the seminal  vesicles and the margins were clear. The patient is on observation  at this time.  3. 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 Para Skeans on  06/04/2011. There was no melanoma in the resected specimen of June 04, 2011.  4. Rheumatoid arthritis diagnosed in 1998.  5. Hypothyroidism diagnosed 1996.   MEDICATIONS:  Reviewed and recorded. Current Outpatient Prescriptions  Medication Sig Dispense Refill  . aspirin 81 MG tablet Take 81 mg by mouth every other day.      . hydroxychloroquine (PLAQUENIL) 200 MG tablet 200 mg 2 (two) times daily.       Marland Kitchen levothyroxine (SYNTHROID, LEVOTHROID) 125 MCG tablet Take 125 mcg by mouth daily.      . Multiple Vitamins-Minerals (MULTIVITAMIN PO) Take by mouth daily.       No current facility-administered medications for this visit.    SMOKING HISTORY:  The patient has never smoked cigarettes.   HISTORY:  I am seeing Siaosi Alter today for followup of his non- Hodgkin lymphoma, B cell type, involving the dermis of the mid back. Mr. Corales was last seen by [place] on 04/16/2012.  He continues to do well without any symptoms whatsoever.  He remains physically active, also very  health conscious.  He does a lot of bike riding.  He has had no fever, chills, night sweats, or any sense of ill health.  His prostate cancer is followed by Dr. [name].  The patient has noted no new skin lesions.  He did have a lesion removed by Dr. Terri Piedra from his right medial thigh not too long ago.  The patient denies any medical problems since his last visit here almost a year ago.  PHYSICAL EXAMINATION:  General:  The patient certainly looks well.  He is a trim, healthy-appearing 67 year old gentleman looking somewhat younger than his stated age.  Weight is 149 pounds 9.6 ounces, height 5 feet 8-1/2 inches, body surface area 1.81 sq m.  Vital Signs:  Blood pressure 162/71.  Other vital signs are normal.  HEENT:  There is no scleral icterus.  Mouth and pharynx are benign.  No peripheral adenopathy palpable.  Heart and lungs:  Normal.  Back:  The area in the upper mid back is well healed with no evidence of recurrent lymphoma. The area is barely perceptible.  Skin:  No other suspicious skin lesions.  The patient does have a lot of freckling and some evidence of sun-related skin damage over his arms and legs.  No axillary or inguinal adenopathy.  The patient does have a giant nevus on his left posterior thigh.  Abdomen:  Remarkable for palpable liver edge which descends with inspiration 2-3 cm below the right costal margin.  No  splenomegaly, abdominal masses, or ascites.  Extremities:  No peripheral edema, clubbing, petechiae, or purpura.  Neurologic:  Exam was grossly normal.  LABORATORY DATA:  Today, white count. 6.2, ANC 3.2, hemoglobin 14.8, hematocrit 44.5, platelets 257,000.  Chemistries today are pending.  LDH today was 169.  Chemistries from 04/16/2012 were normal.  PSA from 04/16/2012 was 0.02.  PSA today is pending.  IMAGING STUDIES:  1. PET scan from 07/04/2009 was negative for lymphoma.  2. CT scan of abdomen and pelvis with IV contrast on 04/01/2011 was  negative for  lymphoma recurrence.  3. Chest x-ray, 2 views, from 04/01/2011 showed stable hyperinflation  with no active disease.   IMPRESSION:  Mr. Lampe continues to do well, now almost 4 years from the time of diagnosis of his large B cell lymphoma involving the skin located on his upper mid back.  The patient is without evidence of recurrent disease.  I feel that at this point, the patient can be followed by his internist, Dr. Kevan Ny, as well as his dermatologist, Dr. Terri Piedra.  I do not think it is necessary for Mr. Grave to continue seeing me; however, I will be more than happy to see him again should any questions or concerns arise in the future.    ______________________________ Samul Dada, M.D. DSM/MEDQ  D:  04/12/2013  T:  04/13/2013  Job:  [id number]

## 2013-04-13 NOTE — Telephone Encounter (Signed)
S/w pt that his PSA was 0.03. He thanked [place] for the call.

## 2013-04-15 ENCOUNTER — Other Ambulatory Visit: Payer: BC Managed Care – PPO | Admitting: Lab

## 2013-04-15 ENCOUNTER — Ambulatory Visit: Payer: BC Managed Care – PPO | Admitting: Oncology

## 2015-12-28 ENCOUNTER — Other Ambulatory Visit: Payer: Self-pay | Admitting: Internal Medicine

## 2015-12-28 ENCOUNTER — Ambulatory Visit
Admission: RE | Admit: 2015-12-28 | Discharge: 2015-12-28 | Disposition: A | Discharge status: Home / Self Care | Payer: BLUE CROSS/BLUE SHIELD | Source: Ambulatory Visit | Attending: Internal Medicine | Admitting: Internal Medicine

## 2015-12-28 DIAGNOSIS — R030 Elevated blood-pressure reading, without diagnosis of hypertension: Secondary | ICD-10-CM

## 2017-01-27 IMAGING — CR DG CHEST 2V
2 series · 2 of 2 positions shown · non-contrast
Comparison: 04/01/2011

CLINICAL DATA: Elevated blood pressure.

EXAM:
CHEST  2 VIEW

[w chest pa]
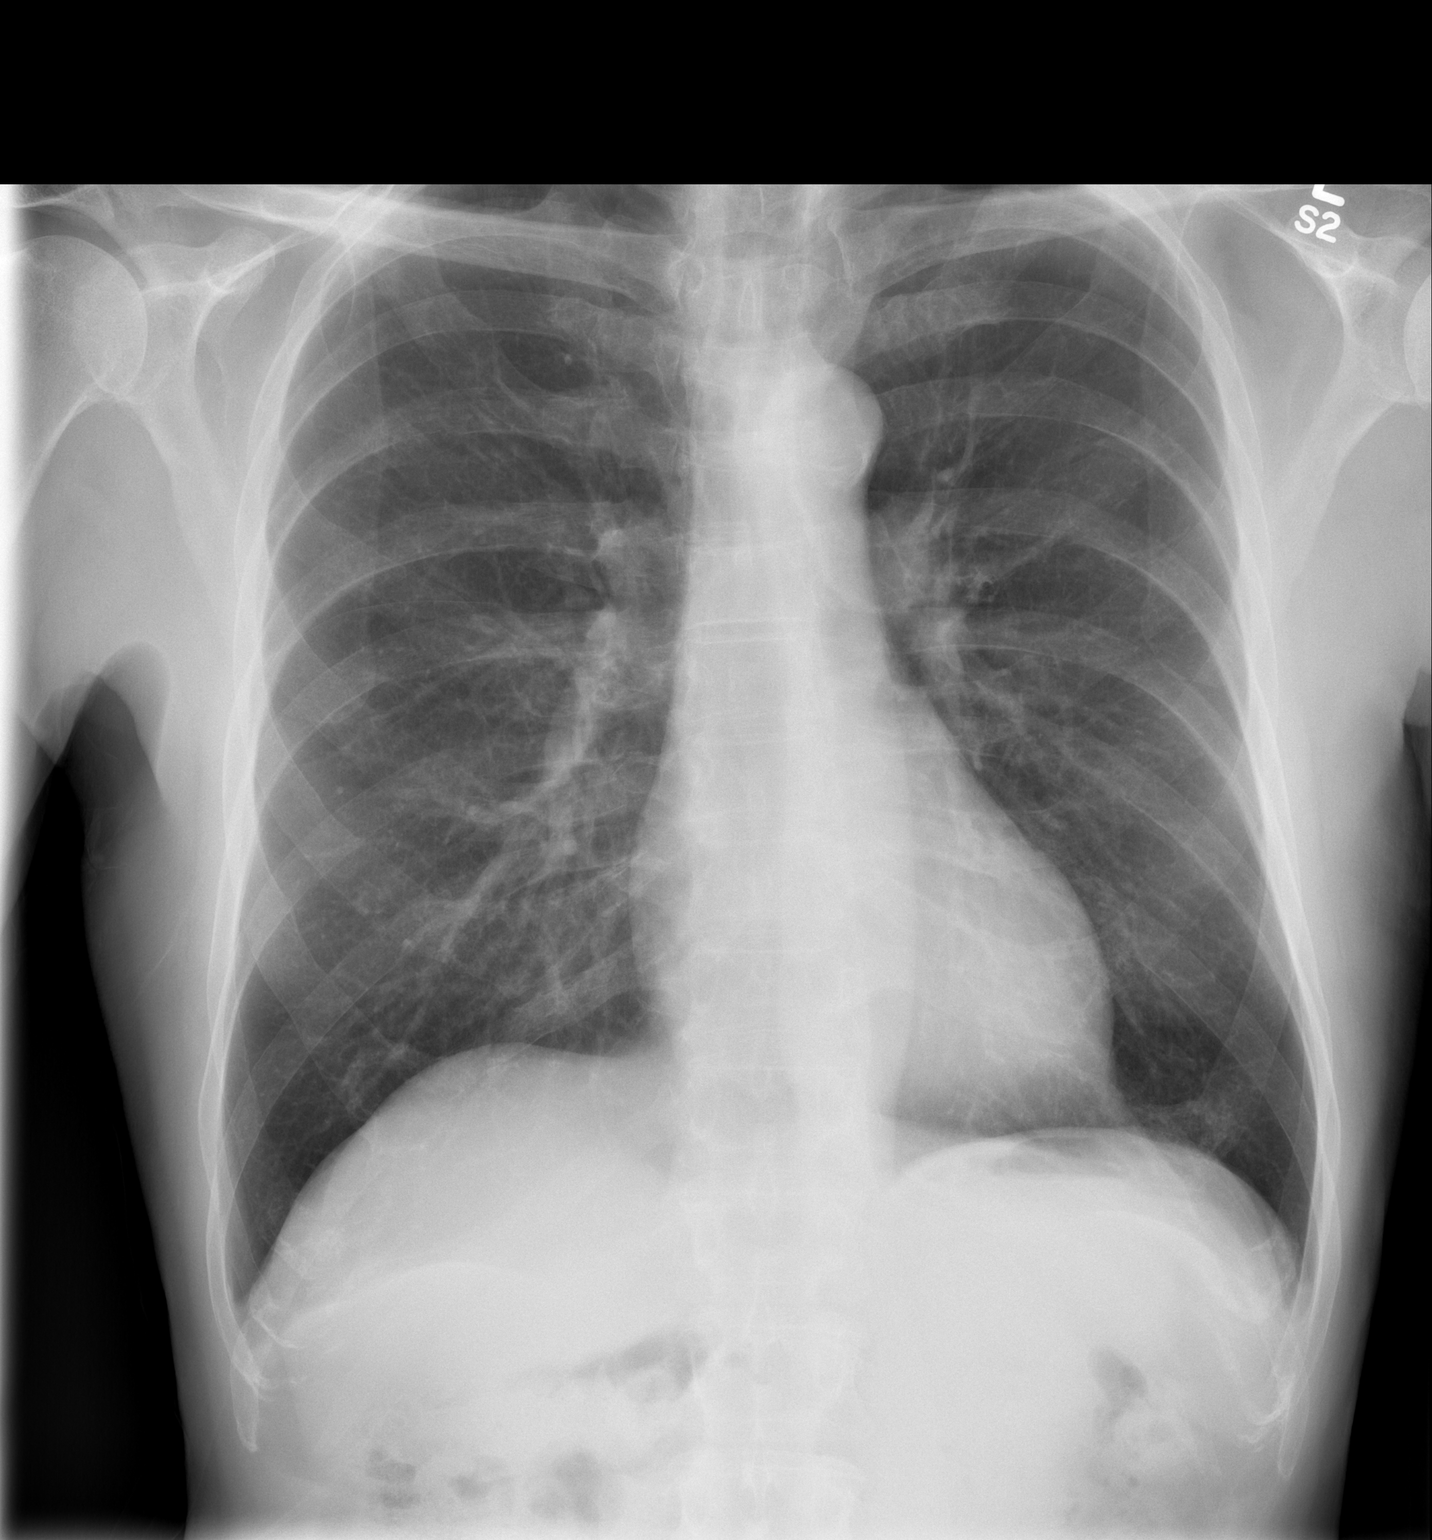

[w chest lat]
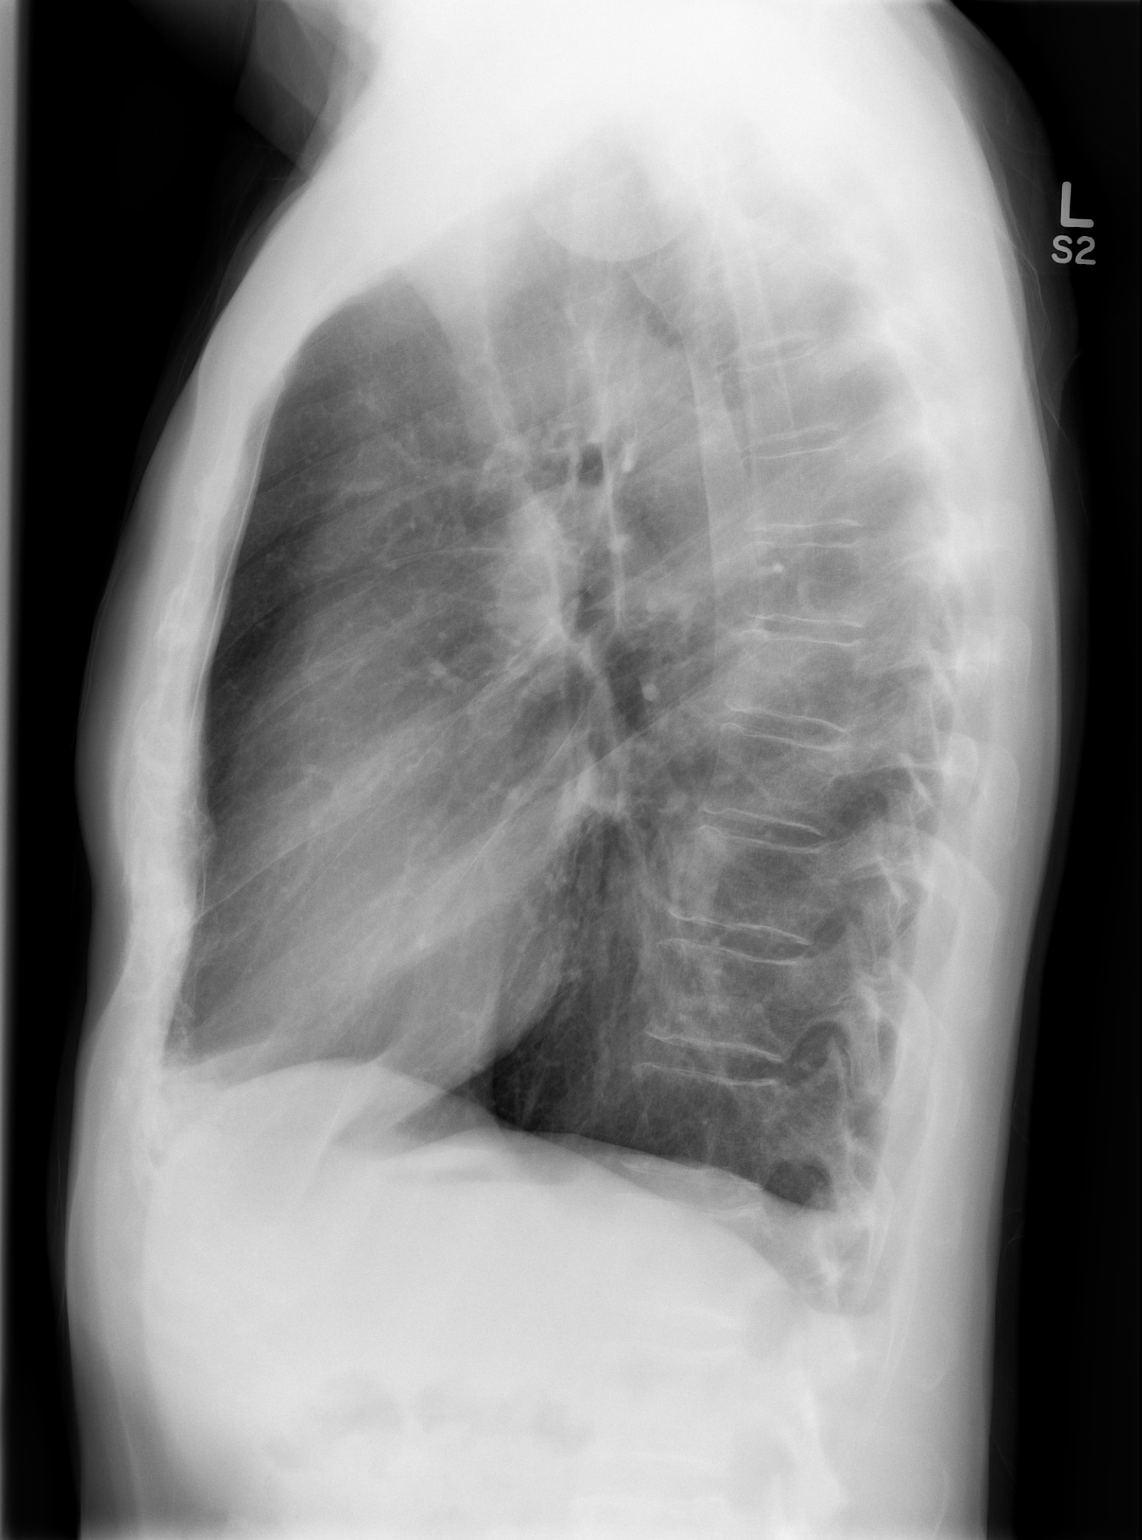

[2 of 2 positions shown; findings below may reference images not displayed]

FINDINGS: The cardiac silhouette, mediastinal and hilar contours are within
normal limits and stable. There is mild tortuosity and calcification
of the thoracic aorta. Hyperinflation and emphysematous changes but
no acute pulmonary findings. No pleural effusions. No worrisome
pulmonary lesions. The bony thorax is intact.
IMPRESSION: Hyperinflation and emphysematous changes but no acute pulmonary
findings.

## 2017-02-20 ENCOUNTER — Other Ambulatory Visit: Payer: Self-pay | Admitting: Gastroenterology

## 2017-05-01 ENCOUNTER — Encounter (HOSPITAL_COMMUNITY): Payer: Self-pay | Admitting: *Deleted

## 2017-05-04 ENCOUNTER — Ambulatory Visit (HOSPITAL_COMMUNITY): Payer: BLUE CROSS/BLUE SHIELD | Admitting: Anesthesiology

## 2017-05-04 ENCOUNTER — Encounter (HOSPITAL_COMMUNITY): Payer: Self-pay | Admitting: Anesthesiology

## 2017-05-04 ENCOUNTER — Encounter (HOSPITAL_COMMUNITY)
Admission: RE | Disposition: A | Discharge status: Home / Self Care | Payer: Self-pay | Source: Ambulatory Visit | Attending: Gastroenterology

## 2017-05-04 ENCOUNTER — Ambulatory Visit (HOSPITAL_COMMUNITY)
Admission: RE | Admit: 2017-05-04 | Discharge: 2017-05-04 | Disposition: A | Discharge status: Home / Self Care | Payer: BLUE CROSS/BLUE SHIELD | Source: Ambulatory Visit | Attending: Gastroenterology | Admitting: Gastroenterology

## 2017-05-04 DIAGNOSIS — M069 Rheumatoid arthritis, unspecified: Secondary | ICD-10-CM | POA: Diagnosis not present

## 2017-05-04 DIAGNOSIS — E039 Hypothyroidism, unspecified: Secondary | ICD-10-CM | POA: Diagnosis not present

## 2017-05-04 DIAGNOSIS — E7800 Pure hypercholesterolemia, unspecified: Secondary | ICD-10-CM | POA: Diagnosis not present

## 2017-05-04 DIAGNOSIS — Z8546 Personal history of malignant neoplasm of prostate: Secondary | ICD-10-CM | POA: Insufficient documentation

## 2017-05-04 DIAGNOSIS — D125 Benign neoplasm of sigmoid colon: Secondary | ICD-10-CM | POA: Diagnosis not present

## 2017-05-04 DIAGNOSIS — Z1211 Encounter for screening for malignant neoplasm of colon: Secondary | ICD-10-CM | POA: Insufficient documentation

## 2017-05-04 DIAGNOSIS — Z85820 Personal history of malignant melanoma of skin: Secondary | ICD-10-CM | POA: Diagnosis not present

## 2017-05-04 HISTORY — DX: Unspecified asthma, uncomplicated: J45.909

## 2017-05-04 HISTORY — PX: COLONOSCOPY WITH PROPOFOL: SHX5780

## 2017-05-04 HISTORY — DX: Hypothyroidism, unspecified: E03.9

## 2017-05-04 SURGERY — COLONOSCOPY WITH PROPOFOL
Anesthesia: Monitor Anesthesia Care

## 2017-05-04 MED ORDER — LACTATED RINGERS IV SOLN
INTRAVENOUS | Status: DC
  Administered 2017-05-04: 1000 mL via INTRAVENOUS

## 2017-05-04 MED ORDER — SODIUM CHLORIDE 0.9 % IV SOLN: INTRAVENOUS | Status: DC

## 2017-05-04 MED ORDER — PROPOFOL 500 MG/50ML IV EMUL
INTRAVENOUS | Status: DC | PRN
  Administered 2017-05-04: 100 ug/kg/min via INTRAVENOUS

## 2017-05-04 MED ORDER — PROPOFOL 10 MG/ML IV BOLUS
INTRAVENOUS | Status: AC
  Filled 2017-05-04: qty 20

## 2017-05-04 MED ORDER — PROPOFOL 10 MG/ML IV BOLUS
INTRAVENOUS | Status: DC | PRN
  Administered 2017-05-04 (×3): 20 mg via INTRAVENOUS
  Administered 2017-05-04: 50 mg via INTRAVENOUS
  Administered 2017-05-04: 20 mg via INTRAVENOUS
  Administered 2017-05-04: 30 mg via INTRAVENOUS
  Administered 2017-05-04 (×2): 20 mg via INTRAVENOUS

## 2017-05-04 MED ORDER — PROPOFOL 10 MG/ML IV BOLUS
INTRAVENOUS | Status: AC
Start: 2017-05-04 — End: 2017-05-04
  Filled 2017-05-04: qty 40

## 2017-05-04 SURGICAL SUPPLY — 21 items

## 2017-05-04 NOTE — Anesthesia Preprocedure Evaluation (Signed)
Anesthesia Evaluation  Patient identified by MRN, date of birth, ID band Patient awake    Reviewed: Allergy & Precautions, NPO status , Patient's Chart, lab work & pertinent test results  History of Anesthesia Complications Negative for: history of anesthetic complications  Airway Mallampati: I  TM Distance: >3 FB Neck ROM: Full    Dental  (+) Teeth Intact   Pulmonary asthma ,    breath sounds clear to auscultation       Cardiovascular negative cardio ROS   Rhythm:Regular     Neuro/Psych negative neurological ROS  negative psych ROS   GI/Hepatic negative GI ROS, Neg liver ROS,   Endo/Other  Hypothyroidism   Renal/GU negative Renal ROS     Musculoskeletal  (+) Arthritis ,   Abdominal   Peds  Hematology negative hematology ROS (+)   Anesthesia Other Findings   Reproductive/Obstetrics                             Anesthesia Physical Anesthesia Plan  ASA: II  Anesthesia Plan: MAC   Post-op Pain Management:    Induction:   Airway Management Planned: Natural Airway, Nasal Cannula and Simple Face Mask  Additional Equipment: None  Intra-op Plan:   Post-operative Plan:   Informed Consent: I have reviewed the patients History and Physical, chart, labs and discussed the procedure including the risks, benefits and alternatives for the proposed anesthesia with the patient or authorized representative who has indicated his/her understanding and acceptance.   Dental advisory given  Plan Discussed with: CRNA and Surgeon  Anesthesia Plan Comments:         Anesthesia Quick Evaluation

## 2017-05-04 NOTE — Transfer of Care (Signed)
Immediate Anesthesia Transfer of Care Note  Patient: Malik Beck  Procedure(s) Performed: Procedure(s): COLONOSCOPY WITH PROPOFOL (N/A)  Patient Location: PACU  Anesthesia Type:MAC  Level of Consciousness:  sedated, patient cooperative and responds to stimulation  Airway & Oxygen Therapy:Patient Spontanous Breathing and Patient connected to face mask oxgen  Post-op Assessment:  Report given to PACU RN and Post -op Vital signs reviewed and stable  Post vital signs:  Reviewed and stable  Last Vitals:  Vitals:   05/04/17 0835  BP: (!) 199/97  Pulse: 65  Resp: 13  Temp: 42.3 C    Complications: No apparent anesthesia complications

## 2017-05-04 NOTE — Op Note (Addendum)
[facility] : [facility] Patient Name: Malik Beck Procedure Date: 05/04/2017 MRN: [id number] Attending MD: Garlan Fair , MD Date of Birth: Mar 23, 1946 CSN: [id number] Age: 71 Admit Type: Outpatient Procedure:                Colonoscopy Indications:              Screening for colorectal malignant neoplasm Providers:                Garlan Fair, MD, Carmie End, RN, Tinnie Gens, Technician, William Dalton, Technician Referring MD:              Medicines:                Propofol per Anesthesia Complications:            No immediate complications. Estimated Blood Loss:     Estimated blood loss was minimal. Procedure:                Pre-Anesthesia Assessment:                           - Prior to the procedure, a History and Physical                            was performed, and patient medications and                            allergies were reviewed. The patient's tolerance of                            previous anesthesia was also reviewed. The risks                            and benefits of the procedure and the sedation                            options and risks were discussed with the patient.                            All questions were answered, and informed consent                            was obtained. Prior Anticoagulants: The patient has                            taken aspirin, last dose was 1 day prior to                            procedure. ASA Grade Assessment: II - A patient                            with mild systemic disease. After reviewing the  risks and benefits, the patient was deemed in                            satisfactory condition to undergo the procedure.                           After obtaining informed consent, the colonoscope                            was passed under direct vision. Throughout the                            procedure, the patient's blood pressure, pulse, and                             oxygen saturations were monitored continuously. The                            EC-[id number] ([id number]) scope was introduced through                            the anus and advanced to the the cecum, identified                            by appendiceal orifice and ileocecal valve. The                            colonoscopy was technically difficult and complex                            due to significant looping. The patient tolerated                            the procedure well. The quality of the bowel                            preparation was good. The terminal ileum, the                            ileocecal valve, the appendiceal orifice and the                            rectum were photographed. Scope In: 8:51:53 AM Scope Out: 9:24:01 AM Scope Withdrawal Time: 0 hours 15 minutes 51 seconds  Total Procedure Duration: 0 hours 32 minutes 8 seconds  Findings:      The perianal and digital rectal examinations were normal.      A 5 mm polyp was found in the proximal sigmoid colon. The polyp was       sessile. The polyp was removed with a cold snare. Resection and       retrieval were complete.      The exam was otherwise without abnormality. Impression:               - One 5 mm polyp in the proximal sigmoid  colon,                            removed with a cold snare. Resected and retrieved.                           - The examination was otherwise normal. Moderate Sedation:      N/A- Per Anesthesia Care Recommendation:           - Patient has a contact number available for                            emergencies. The signs and symptoms of potential                            delayed complications were discussed with the                            patient. Return to normal activities tomorrow.                            Written discharge instructions were provided to the                            patient.                           - Repeat colonoscopy is not  recommended for                            screening purposes.                           - Resume previous diet.                           - Continue present medications. Procedure Code(s):        --- Professional ---                           [phone], Colonoscopy, flexible; with removal of                            tumor(s), polyp(s), or other lesion(s) by snare                            technique Diagnosis Code(s):        --- Professional ---                           Z12.11, Encounter for screening for malignant                            neoplasm of colon                           D12.5, Benign neoplasm of sigmoid colon CPT copyright 2016 American Medical  Association. All rights reserved. The codes documented in this report are preliminary and upon coder review may  be revised to meet current compliance requirements. Earle Gell, MD Garlan Fair, MD 05/04/2017 9:31:22 AM This report has been signed electronically. Number of Addenda: 0

## 2017-05-04 NOTE — Discharge Instructions (Signed)
YOU HAD AN ENDOSCOPIC PROCEDURE TODAY: Refer to the procedure report and other information in the discharge instructions given to you for any specific questions about what was found during the examination. If this information does not answer your questions, please call Dr. Wynetta Emery office at [phone] to clarify.   YOU SHOULD EXPECT: Some feelings of bloating in the abdomen. Passage of more gas than usual. Walking can help get rid of the air that was put into your GI tract during the procedure and reduce the bloating. If you had a lower endoscopy (such as a colonoscopy or flexible sigmoidoscopy) you may notice spotting of blood in your stool or on the toilet paper. Some abdominal soreness may be present for a day or two, also.  DIET: Your first meal following the procedure should be a light meal and then it is ok to progress to your normal diet. A half-sandwich or bowl of soup is an example of a good first meal. Heavy or fried foods are harder to digest and may make you feel nauseous or bloated. Drink plenty of fluids but you should avoid alcoholic beverages for 24 hours. If you had a esophageal dilation, please see attached instructions for diet.   ACTIVITY: Your care partner should take you home directly after the procedure. You should plan to take it easy, moving slowly for the rest of the day. You can resume normal activity the day after the procedure however YOU SHOULD NOT DRIVE, use power tools, machinery or perform tasks that involve climbing or major physical exertion for 24 hours (because of the sedation medicines used during the test).   SYMPTOMS TO REPORT IMMEDIATELY: A gastroenterologist can be reached at any hour. Please call [phone] for any of the following symptoms:  Following lower endoscopy (colonoscopy, flexible sigmoidoscopy) Excessive amounts of blood in the stool  Significant tenderness, worsening of abdominal pains  Swelling of the abdomen that is new, acute  Fever of 100  or higher  Following upper endoscopy (EGD, EUS, ERCP, esophageal dilation) Vomiting of blood or coffee ground material  New, significant abdominal pain  New, significant chest pain or pain under the shoulder blades  Painful or persistently difficult swallowing  New shortness of breath  Black, tarry-looking or red, bloody stools  FOLLOW UP:  If any biopsies were taken you will be contacted by phone or by letter within the next 1-3 weeks. Call [phone]  if you have not heard about the biopsies in 3 weeks.  Please also call with any specific questions about appointments or follow up tests.  YOU HAD AN ENDOSCOPIC PROCEDURE TODAY: Refer to the procedure report and other information in the discharge instructions given to you for any specific questions about what was found during the examination. If this information does not answer your questions, please call Dr. Wynetta Emery office at [phone] to clarify.   YOU SHOULD EXPECT: Some feelings of bloating in the abdomen. Passage of more gas than usual. Walking can help get rid of the air that was put into your GI tract during the procedure and reduce the bloating. If you had a lower endoscopy (such as a colonoscopy or flexible sigmoidoscopy) you may notice spotting of blood in your stool or on the toilet paper. Some abdominal soreness may be present for a day or two, also.  DIET: Your first meal following the procedure should be a light meal and then it is ok to progress to your normal diet. A half-sandwich or bowl of soup is an example  of a good first meal. Heavy or fried foods are harder to digest and may make you feel nauseous or bloated. Drink plenty of fluids but you should avoid alcoholic beverages for 24 hours. If you had a esophageal dilation, please see attached instructions for diet.   ACTIVITY: Your care partner should take you home directly after the procedure. You should plan to take it easy, moving slowly for the rest of the day. You can  resume normal activity the day after the procedure however YOU SHOULD NOT DRIVE, use power tools, machinery or perform tasks that involve climbing or major physical exertion for 24 hours (because of the sedation medicines used during the test).   SYMPTOMS TO REPORT IMMEDIATELY: A gastroenterologist can be reached at any hour. Please call [phone] for any of the following symptoms:  Following lower endoscopy (colonoscopy, flexible sigmoidoscopy) Excessive amounts of blood in the stool  Significant tenderness, worsening of abdominal pains  Swelling of the abdomen that is new, acute  Fever of 100 or higher  Following upper endoscopy (EGD, EUS, ERCP, esophageal dilation) Vomiting of blood or coffee ground material  New, significant abdominal pain  New, significant chest pain or pain under the shoulder blades  Painful or persistently difficult swallowing  New shortness of breath  Black, tarry-looking or red, bloody stools  FOLLOW UP:  If any biopsies were taken you will be contacted by phone or by letter within the next 1-3 weeks. Call [phone]  if you have not heard about the biopsies in 3 weeks.  Please also call with any specific questions about appointments or follow up tests.

## 2017-05-04 NOTE — Anesthesia Postprocedure Evaluation (Addendum)
Anesthesia Post Note  Patient: Malik Beck  Procedure(s) Performed: Procedure(s) (LRB): COLONOSCOPY WITH PROPOFOL (N/A)  Patient location during evaluation: PACU Anesthesia Type: MAC Level of consciousness: awake and alert Pain management: pain level controlled Vital Signs Assessment: post-procedure vital signs reviewed and stable Respiratory status: spontaneous breathing, nonlabored ventilation, respiratory function stable and patient connected to nasal cannula oxygen Cardiovascular status: stable and blood pressure returned to baseline Anesthetic complications: no       Last Vitals:  Vitals:   05/04/17 0835 05/04/17 0930  BP: (!) 199/97 132/70  Pulse: 65 (!) 54  Resp: 13 16  Temp: 36.4 C 36.4 C    Last Pain:  Vitals:   05/04/17 0930  TempSrc: Oral                 [name]

## 2017-05-04 NOTE — H&P (Signed)
Procedure: Screening colonoscopy to evaluate positive cologuard stool colon cancer screening test  History: The patient is a 71 year old male born 28-Jan-1946. He is scheduled to undergo a screening colonoscopy today.  Past medical history: Radical prostatectomy to treat prostate cancer. Foot fracture surgery. Bilateral inguinal hernia surgeries. Cutaneous non-Hodgkin's lymphoma. Rheumatoid arthritis. Hypercholesterolemia. Melanoma skin cancer surgery.  Exam: The patient is alert and lying comfortably on the endoscopy stretcher. Abdomen is soft and nontender to palpation. Lungs are clear to auscultation. Cardiac exam reveals a regular rhythm.  Plan: Proceed with screening colonoscopy

## 2017-05-18 NOTE — Addendum Note (Signed)
Addendum  created 05/18/17 1545 by Oleta Mouse, MD   Sign clinical note

## 2017-11-10 DIAGNOSIS — M25552 Pain in left hip: Secondary | ICD-10-CM | POA: Diagnosis not present

## 2017-11-10 DIAGNOSIS — G5702 Lesion of sciatic nerve, left lower limb: Secondary | ICD-10-CM | POA: Diagnosis not present

## 2017-12-28 DIAGNOSIS — Z85828 Personal history of other malignant neoplasm of skin: Secondary | ICD-10-CM | POA: Diagnosis not present

## 2017-12-28 DIAGNOSIS — L570 Actinic keratosis: Secondary | ICD-10-CM | POA: Diagnosis not present

## 2017-12-28 DIAGNOSIS — L812 Freckles: Secondary | ICD-10-CM | POA: Diagnosis not present

## 2017-12-28 DIAGNOSIS — L821 Other seborrheic keratosis: Secondary | ICD-10-CM | POA: Diagnosis not present

## 2017-12-28 DIAGNOSIS — Z85820 Personal history of malignant melanoma of skin: Secondary | ICD-10-CM | POA: Diagnosis not present

## 2018-02-01 DIAGNOSIS — Z79899 Other long term (current) drug therapy: Secondary | ICD-10-CM | POA: Diagnosis not present

## 2018-02-08 DIAGNOSIS — E782 Mixed hyperlipidemia: Secondary | ICD-10-CM | POA: Diagnosis not present

## 2018-02-08 DIAGNOSIS — Z79899 Other long term (current) drug therapy: Secondary | ICD-10-CM | POA: Diagnosis not present

## 2018-02-08 DIAGNOSIS — Z8572 Personal history of non-Hodgkin lymphomas: Secondary | ICD-10-CM | POA: Diagnosis not present

## 2018-02-08 DIAGNOSIS — E039 Hypothyroidism, unspecified: Secondary | ICD-10-CM | POA: Diagnosis not present

## 2018-02-08 DIAGNOSIS — M069 Rheumatoid arthritis, unspecified: Secondary | ICD-10-CM | POA: Diagnosis not present

## 2018-02-08 DIAGNOSIS — Z0000 Encounter for general adult medical examination without abnormal findings: Secondary | ICD-10-CM | POA: Diagnosis not present

## 2018-02-08 DIAGNOSIS — Z8546 Personal history of malignant neoplasm of prostate: Secondary | ICD-10-CM | POA: Diagnosis not present

## 2018-02-10 DIAGNOSIS — Z79899 Other long term (current) drug therapy: Secondary | ICD-10-CM | POA: Diagnosis not present

## 2018-05-05 DIAGNOSIS — C61 Malignant neoplasm of prostate: Secondary | ICD-10-CM | POA: Diagnosis not present

## 2018-07-12 DIAGNOSIS — R0602 Shortness of breath: Secondary | ICD-10-CM | POA: Diagnosis not present

## 2018-07-12 DIAGNOSIS — I10 Essential (primary) hypertension: Secondary | ICD-10-CM | POA: Diagnosis not present

## 2018-07-12 DIAGNOSIS — T732XXA Exhaustion due to exposure, initial encounter: Secondary | ICD-10-CM | POA: Diagnosis not present

## 2018-07-21 DIAGNOSIS — I10 Essential (primary) hypertension: Secondary | ICD-10-CM | POA: Diagnosis not present

## 2018-07-21 DIAGNOSIS — T733XXA Exhaustion due to excessive exertion, initial encounter: Secondary | ICD-10-CM | POA: Diagnosis not present

## 2018-07-21 DIAGNOSIS — R0602 Shortness of breath: Secondary | ICD-10-CM | POA: Diagnosis not present

## 2018-08-02 DIAGNOSIS — C61 Malignant neoplasm of prostate: Secondary | ICD-10-CM | POA: Diagnosis not present

## 2018-08-09 DIAGNOSIS — E039 Hypothyroidism, unspecified: Secondary | ICD-10-CM | POA: Diagnosis not present

## 2018-08-09 DIAGNOSIS — T732XXA Exhaustion due to exposure, initial encounter: Secondary | ICD-10-CM | POA: Diagnosis not present

## 2018-08-09 DIAGNOSIS — I10 Essential (primary) hypertension: Secondary | ICD-10-CM | POA: Diagnosis not present

## 2018-08-09 DIAGNOSIS — R0602 Shortness of breath: Secondary | ICD-10-CM | POA: Diagnosis not present

## 2018-10-11 DIAGNOSIS — I10 Essential (primary) hypertension: Secondary | ICD-10-CM | POA: Diagnosis not present

## 2018-10-11 DIAGNOSIS — Z23 Encounter for immunization: Secondary | ICD-10-CM | POA: Diagnosis not present

## 2018-10-11 DIAGNOSIS — E039 Hypothyroidism, unspecified: Secondary | ICD-10-CM | POA: Diagnosis not present

## 2018-10-11 DIAGNOSIS — T732XXA Exhaustion due to exposure, initial encounter: Secondary | ICD-10-CM | POA: Diagnosis not present

## 2018-12-06 DIAGNOSIS — C61 Malignant neoplasm of prostate: Secondary | ICD-10-CM | POA: Diagnosis not present

## 2018-12-14 DIAGNOSIS — N5201 Erectile dysfunction due to arterial insufficiency: Secondary | ICD-10-CM | POA: Diagnosis not present

## 2018-12-14 DIAGNOSIS — C61 Malignant neoplasm of prostate: Secondary | ICD-10-CM | POA: Diagnosis not present

## 2018-12-27 ENCOUNTER — Encounter: Payer: Self-pay | Admitting: Radiation Oncology

## 2018-12-27 NOTE — Progress Notes (Addendum)
GU Location of Tumor / Histology: Biochemical recurrent prostatic adenocarcinoma   If Prostate Cancer, Gleason Score is (3 + 4) and PSA is (5.49) pre-treatment.   Malik Beck is status post a radical prostatectomy in 2009.   12/06/2018 PSA  0.19 08/02/2018 PSA  0.16 05/05/2018 PSA  0.14 10/22/2012 PSA  0.03 10/15/2010 PSA  0.01 03/27/2010 PSA  <0.04 09/26/2009 PSA  <0.04 04/04/2009 PSA  <0.04  FINAL DIAGNOSIS  MICROSCOPIC EXAMINATION AND DIAGNOSIS  1. PROSTATE, PROSTATECTOMY: PROSTATIC ADENOCARCINOMA, GLEASON' S SCORE 3+4=7 INVOLVING RIGHT AND LEFT LOBES WITH FOCAL EXTRACAPSULAR EXTENSION. SEE COMMENT.  2. RIGHT PELVIC LYMPH NODE: SIX BENIGN LYMPH NODES (0/6).  3. LEFT PELVIC LYMPH NODES: EIGHT BENIGN LYMPH NODES (0/8).  Past/Anticipated interventions by urology, if any: prostate biopsy, prostatectomy, surveillance, referral from consideration of salvage radiotherapy  Past/Anticipated interventions by medical oncology, if any: no  Weight changes, if any: no  Bowel/Bladder complaints, if any: IPSS 1. SHIM 16. Denies dysuria or hematuria. Denies urinary leakage or incontinence.   Nausea/Vomiting, if any: no  Pain issues, if any:  no  SAFETY ISSUES:  Prior radiation? 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 no  Possible current pregnancy? no, male patient  Is the patient on methotrexate? no  Current Complaints / other details:  73 year old male. Married. Father with hx of prostate ca.

## 2018-12-28 ENCOUNTER — Encounter: Payer: Self-pay | Admitting: Radiation Oncology

## 2018-12-28 ENCOUNTER — Encounter: Payer: Self-pay | Admitting: Medical Oncology

## 2018-12-28 ENCOUNTER — Ambulatory Visit
Admission: RE | Admit: 2018-12-28 | Discharge: 2018-12-28 | Disposition: A | Discharge status: Home / Self Care | Payer: BLUE CROSS/BLUE SHIELD | Source: Ambulatory Visit | Attending: Radiation Oncology | Admitting: Radiation Oncology

## 2018-12-28 ENCOUNTER — Other Ambulatory Visit: Payer: Self-pay

## 2018-12-28 DIAGNOSIS — C61 Malignant neoplasm of prostate: Secondary | ICD-10-CM | POA: Diagnosis not present

## 2018-12-28 DIAGNOSIS — R9721 Rising PSA following treatment for malignant neoplasm of prostate: Secondary | ICD-10-CM | POA: Diagnosis not present

## 2018-12-28 DIAGNOSIS — Z8042 Family history of malignant neoplasm of prostate: Secondary | ICD-10-CM | POA: Diagnosis not present

## 2018-12-28 DIAGNOSIS — Z9079 Acquired absence of other genital organ(s): Secondary | ICD-10-CM | POA: Diagnosis not present

## 2018-12-28 NOTE — Progress Notes (Signed)
Introduced myself to Malik Beck as the prostate nurse navigator and my role. He is post robotic prostatectomy 09/28/2008. His PSA is slowly rising and he is here today to discuss radiation. He has follow up PSA in 3 months and pending results will determine if he is ready to move forward with treatment. I gave him my business card and asked him to call me with questions or concerns. He voiced understanding.

## 2018-12-28 NOTE — Progress Notes (Signed)
See progress note under physician encounter. 

## 2018-12-28 NOTE — Progress Notes (Signed)
Radiation Oncology         [phone] ________________________________  Initial Outpatient Consultation  Name: Malik Beck MRN: [id number]  Date: 12/28/2018  DOB: 09-22-1946  JE:HUDJS, Malik [place], MD  Malik Bring, MD   REFERRING PHYSICIAN: Raynelle Bring, MD  DIAGNOSIS: 73 y.o. gentleman with a biochemical recurrence of his prostate cancer, now approximately 10 years status post RALP for Gleason 3+4 adenocarcinoma of the prostate with current PSA 0.19.    ICD-10-CM   1. Biochemically recurrent malignant neoplasm of prostate R97.21     HISTORY OF PRESENT ILLNESS: Malik Beck is a 73 y.o. male with a diagnosis of biochemically recurrent prostate cancer. He was diagnosed with Gleason 3+4 prostate cancer by Dr. Diona Beck in July 2009 with a pre-treatment PSA of 5.49. He underwent radical prostatectomy with Dr. Alinda Beck on 09/28/2008 with final pathology revealing a Stage pT3a, Gleason 3+4 prostatic adenocarcinoma involving both right and left lobes with focal extracapsular extension at the posterior lateral left prostate. There was also focal involvement of the left apex. Surgical margins were negative, and there was no seminal vesicle or lymph node involvement. His initial post-op PSA was 0.12 but fell to <0.04 soon after and remained undetectable until 2017. He has been under surveillance since that time by his PCP, Dr. Inda Beck, with slowly increasing PSA values as follows:  11/2018: PSA 0.19 07/2018: PSA 0.16 04/2018: PSA 0.14 01/2018: PSA 0.18 01/2017: PSA 0.11 12/2015: PSA 0.08 10/2012: PSA 0.03 10/2010: PSA 0.01 03/2010: PSA <0.04 09/2009: PSA <0.04 03/2009: PSA <0.04 12/2008: PSA <0.04 10/2008: PSA 0.12 (post-op)  The patient reviewed the post-operative PSA results with Dr. Alinda Beck and he has kindly been referred today for discussion of salvage radiation treatment.   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 Arthritis    1 finger  . Asthma    childhood asthma none now  . Cancer ([place])    nhl, prostate ca  . Hypothyroidism   . Prostate cancer ([place]) 2009  . Skin cancer April 2012      PAST SURGICAL HISTORY: Past Surgical History:  Procedure Laterality Date  . big toe surgery  yrs ago  . COLONOSCOPY WITH PROPOFOL N/A 05/04/2017   Procedure: COLONOSCOPY WITH PROPOFOL;  Surgeon: Malik Fair, MD;  Location: [facility];  Service: Endoscopy;  Laterality: N/A;  . colonscopy  yrs ago  . NASAL SEPTUM SURGERY  yrs ago  . XI ROBOTIC ASSISTED SIMPLE PROSTATECTOMY  2009    FAMILY HISTORY:  Family History  Problem Relation Age of Onset  . Prostate cancer Father   . Breast cancer Neg Hx   . Colon cancer Neg Hx   . Pancreatic cancer Neg Hx     SOCIAL HISTORY:  Social History   Socioeconomic History  . Marital status: Married    Spouse name: Not on file  . Number of children: Not on file  . Years of education: Not on file  . Highest education level: Not on file  Occupational History  . Not on file  Social Needs  . Financial resource strain: Not on file  . Food insecurity:    Worry: Not on file    Inability: Not on file  . Transportation needs:    Medical: Not on file    Non-medical: Not on file  Tobacco Use  . Smoking status:  Never Smoker  . Smokeless tobacco: Never Used  Substance and Sexual Activity  . Alcohol use: Yes    Alcohol/week: 1.0 standard drinks    Types: 1 Glasses of wine per week    Comment: seldom  . Drug use: No  . Sexual activity: Yes  Lifestyle  . Physical activity:    Days per week: Not on file    Minutes per session: Not on file  . Stress: Not on file  Relationships  . Social connections:    Talks on phone: Not on file    Gets together: Not on file    Attends religious service: Not on file    Active member of club or organization: Not on  file    Attends meetings of clubs or organizations: Not on file    Relationship status: Not on file  . Intimate partner violence:    Fear of current or ex partner: Not on file    Emotionally abused: Not on file    Physically abused: Not on file    Forced sexual activity: Not on file  Other Topics Concern  . Not on file  Social History Narrative   Resides in [place]. Very physically active, enjoys cycling.    ALLERGIES: Patient has no known allergies.  MEDICATIONS:  Current Outpatient Medications  Medication Sig Dispense Refill  . amLODipine (NORVASC) 5 MG tablet Take 5 mg by mouth daily.    Marland Kitchen aspirin 81 MG tablet Take 81 mg by mouth every other day.    . hydroxychloroquine (PLAQUENIL) 200 MG tablet 200 mg 2 (two) times daily.     Marland Kitchen levothyroxine (SYNTHROID, LEVOTHROID) 125 MCG tablet Take 125 mcg by mouth daily.    . Multiple Vitamins-Minerals (MULTIVITAMIN PO) Take 1 tablet by mouth daily.     . valsartan (DIOVAN) 160 MG tablet      No current facility-administered medications for this encounter.     REVIEW OF SYSTEMS:  On review of systems, the patient reports that he is doing well overall. He denies any chest pain, shortness of breath, cough, fevers, chills, night sweats, or unintended weight changes. He denies any bowel disturbances, and denies abdominal pain, nausea or vomiting. He denies any new musculoskeletal or joint aches or pains. His IPSS was 1, indicating mild urinary symptoms. He denies dysuria, hematuria, leakage or incontinence. His SHIM was 16, indicating he does have erectile dysfunction. A complete review of systems is obtained and is otherwise negative.    PHYSICAL EXAM:  Wt Readings from Last 3 Encounters:  12/28/18 147 lb 3.2 oz (66.8 kg)  05/04/17 140 lb (63.5 kg)  04/12/13 149 lb 9.6 oz (67.9 kg)   Temp Readings from Last 3 Encounters:  05/04/17 97.6 F (36.4 C) (Oral)  04/12/13 97.3 F (36.3 C) (Oral)  04/16/12 (!) 96.8 F (36 C) (Oral)   BP  Readings from Last 3 Encounters:  12/28/18 (!) 166/85  05/04/17 132/70  04/12/13 (!) 162/71   Pulse Readings from Last 3 Encounters:  12/28/18 70  05/04/17 (!) 54  04/12/13 93   Pain Assessment Pain Score: 0-No pain/10  In general this is a well appearing caucasian male in no acute distress. He is alert and oriented x4 and appropriate throughout the examination. HEENT reveals that the patient is normocephalic, atraumatic. EOMs are intact. PERRLA. Skin is intact without any evidence of gross lesions. Cardiovascular exam reveals a regular rate and rhythm, no clicks rubs or murmurs are auscultated. Chest is clear to auscultation bilaterally.  Lymphatic assessment is performed and does not reveal any adenopathy in the cervical, supraclavicular, axillary, or inguinal chains. Abdomen has active bowel sounds in all quadrants and is intact. The abdomen is soft, non tender, non distended. Lower extremities are negative for pretibial pitting edema, deep calf tenderness, cyanosis or clubbing.   KPS = 100  100 - Normal; no complaints; no evidence of disease. 90   - Able to carry on normal activity; minor signs or symptoms of disease. 80   - Normal activity with effort; some signs or symptoms of disease. 28   - Cares for self; unable to carry on normal activity or to do active work. 60   - Requires occasional assistance, but is able to care for most of his personal needs. 50   - Requires considerable assistance and frequent medical care. 92   - Disabled; requires special care and assistance. 85   - Severely disabled; hospital admission is indicated although death not imminent. 10   - Very sick; hospital admission necessary; active supportive treatment necessary. 10   - Moribund; fatal processes progressing rapidly. 0     - Dead  Karnofsky DA, Abelmann [place], Craver LS and Kellogg JH [phone]) The use of the nitrogen mustards in the palliative treatment of carcinoma: with particular reference to  bronchogenic carcinoma Cancer 1 [id number]  LABORATORY DATA:  Lab Results  Component Value Date   WBC 6.2 04/12/2013   HGB 14.8 04/12/2013   HCT 44.5 04/12/2013   MCV 92.7 04/12/2013   PLT 257 04/12/2013   Lab Results  Component Value Date   NA 136 04/12/2013   K 4.4 04/12/2013   CL 99 04/12/2013   CO2 28 04/12/2013   Lab Results  Component Value Date   ALT 13 04/12/2013   AST 23 04/12/2013   ALKPHOS 61 04/12/2013   BILITOT 0.27 04/12/2013     RADIOGRAPHY: No results found.    IMPRESSION/PLAN: 1. 73 y.o. gentleman with a biochemical recurrence of his prostate cancer, now approximately 10 years status post RALP for Gleason 3+4 adenocarcinoma of the prostate with current PSA 0.19. Today we reviewed the findings and workup thus far.  We discussed the natural history of prostate cancer.  We reviewed the the implications of positive margins, extracapsular extension, and seminal vesicle involvement on the risk of prostate cancer recurrence, particularly in light of the patient's extracapsular extension noted at the time of surgery.  We also discussed the fact that he had such a long, disease-free interval and only seeing a very slow, gradual rise of his PSA in recent years is further indicative of local recurrence in the surgical bed.  We reviewed some of the evidence suggesting an advantage for patients who undergo salvage radiotherapy in the setting in terms of disease control and overall survival. There is increasing evidence that careful surveillance with ultrasensitive PSA may provide an opportunity for early salvage in patients who undergo observation, which can lead to excellent results in terms of disease control and survival. We discussed radiation treatment directed to the prostatic fossa with regard to the logistics and delivery of external beam radiation treatment. We also detailed the role of ADT in the treatment of biochemically recurrent prostate cancer and outlined the associated  side effects that could be expected with this therapy.  There is not a strong indication for ADT in addition to salvage radiation in his particular case and the patient reports that he would be quite hesitant to begin ADT for fear that  it would have significant adverse impact on his quality of life as he is an avid cycler and remains quite physically active.  At the end of the conversation the patient is interested in continuing PSA surveillance. We will share our findings with Dr. Alinda Beck and look forward to following along in the patient's progress. The patient has follow-up with Dr. Alinda Beck in April and will have another PSA drawn at that time.  He is hoping to postpone salvage radiation until the late fall or early winter as long as his PSA remains stable, as this would have the least interference with his quality of life and cycling schedule.    Nicholos Johns, PA-C    Tyler Pita, MD  [facility] Direct Dial: [phone]  Fax: [phone] [email or web address]  Skype  [email or web address]  This document serves as a record of services personally performed by Tyler Pita, MD and Freeman Caldron, PA-C. It was created on their behalf by Rae Lips, a trained medical scribe. The creation of this record is based on the scribe's personal observations and the providers' statements to them. This document has been checked and approved by the attending providers.

## 2019-01-10 DIAGNOSIS — L821 Other seborrheic keratosis: Secondary | ICD-10-CM | POA: Diagnosis not present

## 2019-01-10 DIAGNOSIS — L570 Actinic keratosis: Secondary | ICD-10-CM | POA: Diagnosis not present

## 2019-01-10 DIAGNOSIS — L820 Inflamed seborrheic keratosis: Secondary | ICD-10-CM | POA: Diagnosis not present

## 2019-01-10 DIAGNOSIS — L812 Freckles: Secondary | ICD-10-CM | POA: Diagnosis not present

## 2019-01-10 DIAGNOSIS — L432 Lichenoid drug reaction: Secondary | ICD-10-CM | POA: Diagnosis not present

## 2019-01-10 DIAGNOSIS — Z85828 Personal history of other malignant neoplasm of skin: Secondary | ICD-10-CM | POA: Diagnosis not present

## 2019-01-10 DIAGNOSIS — Z85820 Personal history of malignant melanoma of skin: Secondary | ICD-10-CM | POA: Diagnosis not present

## 2019-02-25 DIAGNOSIS — I10 Essential (primary) hypertension: Secondary | ICD-10-CM | POA: Diagnosis not present

## 2019-02-25 DIAGNOSIS — Z79899 Other long term (current) drug therapy: Secondary | ICD-10-CM | POA: Diagnosis not present

## 2019-02-25 DIAGNOSIS — Z8546 Personal history of malignant neoplasm of prostate: Secondary | ICD-10-CM | POA: Diagnosis not present

## 2019-02-25 DIAGNOSIS — E782 Mixed hyperlipidemia: Secondary | ICD-10-CM | POA: Diagnosis not present

## 2019-02-25 DIAGNOSIS — Z0000 Encounter for general adult medical examination without abnormal findings: Secondary | ICD-10-CM | POA: Diagnosis not present

## 2019-02-25 DIAGNOSIS — E559 Vitamin D deficiency, unspecified: Secondary | ICD-10-CM | POA: Diagnosis not present

## 2019-02-25 DIAGNOSIS — M069 Rheumatoid arthritis, unspecified: Secondary | ICD-10-CM | POA: Diagnosis not present

## 2019-02-25 DIAGNOSIS — Z8572 Personal history of non-Hodgkin lymphomas: Secondary | ICD-10-CM | POA: Diagnosis not present

## 2019-02-25 DIAGNOSIS — E039 Hypothyroidism, unspecified: Secondary | ICD-10-CM | POA: Diagnosis not present

## 2019-03-30 DIAGNOSIS — C61 Malignant neoplasm of prostate: Secondary | ICD-10-CM | POA: Diagnosis not present

## 2019-04-06 DIAGNOSIS — C61 Malignant neoplasm of prostate: Secondary | ICD-10-CM | POA: Diagnosis not present

## 2019-04-06 DIAGNOSIS — N5201 Erectile dysfunction due to arterial insufficiency: Secondary | ICD-10-CM | POA: Diagnosis not present

## 2019-07-04 DIAGNOSIS — C61 Malignant neoplasm of prostate: Secondary | ICD-10-CM | POA: Diagnosis not present

## 2019-07-07 DIAGNOSIS — S61452A Open bite of left hand, initial encounter: Secondary | ICD-10-CM | POA: Diagnosis not present

## 2019-07-07 DIAGNOSIS — L03114 Cellulitis of left upper limb: Secondary | ICD-10-CM | POA: Diagnosis not present

## 2019-10-12 DIAGNOSIS — Z23 Encounter for immunization: Secondary | ICD-10-CM | POA: Diagnosis not present

## 2019-11-02 DIAGNOSIS — C61 Malignant neoplasm of prostate: Secondary | ICD-10-CM | POA: Diagnosis not present

## 2019-11-09 DIAGNOSIS — N5201 Erectile dysfunction due to arterial insufficiency: Secondary | ICD-10-CM | POA: Diagnosis not present

## 2019-11-09 DIAGNOSIS — C61 Malignant neoplasm of prostate: Secondary | ICD-10-CM | POA: Diagnosis not present

## 2021-01-08 ENCOUNTER — Other Ambulatory Visit (HOSPITAL_COMMUNITY): Payer: Self-pay | Admitting: Urology

## 2021-01-08 DIAGNOSIS — C61 Malignant neoplasm of prostate: Secondary | ICD-10-CM

## 2021-01-23 ENCOUNTER — Other Ambulatory Visit: Payer: Self-pay

## 2021-01-23 ENCOUNTER — Ambulatory Visit (HOSPITAL_COMMUNITY)
Admission: RE | Admit: 2021-01-23 | Discharge: 2021-01-23 | Disposition: A | Discharge status: Home / Self Care | Payer: No Typology Code available for payment source | Source: Ambulatory Visit | Attending: Urology | Admitting: Urology

## 2021-01-23 DIAGNOSIS — C61 Malignant neoplasm of prostate: Secondary | ICD-10-CM | POA: Diagnosis not present

## 2021-01-23 MED ORDER — PIFLIFOLASTAT F 18 (PYLARIFY) INJECTION
9.0000 | Freq: Once | INTRAVENOUS | Status: AC
  Administered 2021-01-23: 8.85 via INTRAVENOUS

## 2024-02-17 ENCOUNTER — Emergency Department (HOSPITAL_COMMUNITY)

## 2024-02-17 ENCOUNTER — Inpatient Hospital Stay (HOSPITAL_COMMUNITY)
Admission: EM | Admit: 2024-02-17 | Discharge: 2024-02-19 | DRG: 871 | Disposition: A | Discharge status: Home / Self Care | Attending: Internal Medicine | Admitting: Internal Medicine

## 2024-02-17 ENCOUNTER — Other Ambulatory Visit: Payer: Self-pay

## 2024-02-17 ENCOUNTER — Encounter (HOSPITAL_COMMUNITY): Payer: Self-pay | Admitting: Internal Medicine

## 2024-02-17 DIAGNOSIS — D72829 Elevated white blood cell count, unspecified: Secondary | ICD-10-CM | POA: Diagnosis present

## 2024-02-17 DIAGNOSIS — M1990 Unspecified osteoarthritis, unspecified site: Secondary | ICD-10-CM | POA: Diagnosis present

## 2024-02-17 DIAGNOSIS — J9601 Acute respiratory failure with hypoxia: Secondary | ICD-10-CM | POA: Diagnosis present

## 2024-02-17 DIAGNOSIS — J189 Pneumonia, unspecified organism: Secondary | ICD-10-CM | POA: Diagnosis present

## 2024-02-17 DIAGNOSIS — E039 Hypothyroidism, unspecified: Secondary | ICD-10-CM | POA: Diagnosis present

## 2024-02-17 DIAGNOSIS — N179 Acute kidney failure, unspecified: Secondary | ICD-10-CM | POA: Diagnosis present

## 2024-02-17 DIAGNOSIS — E871 Hypo-osmolality and hyponatremia: Secondary | ICD-10-CM | POA: Diagnosis present

## 2024-02-17 DIAGNOSIS — R0602 Shortness of breath: Principal | ICD-10-CM

## 2024-02-17 DIAGNOSIS — J101 Influenza due to other identified influenza virus with other respiratory manifestations: Secondary | ICD-10-CM | POA: Diagnosis present

## 2024-02-17 DIAGNOSIS — I10 Essential (primary) hypertension: Secondary | ICD-10-CM | POA: Diagnosis present

## 2024-02-17 DIAGNOSIS — E876 Hypokalemia: Secondary | ICD-10-CM | POA: Diagnosis not present

## 2024-02-17 DIAGNOSIS — Z1152 Encounter for screening for COVID-19: Secondary | ICD-10-CM

## 2024-02-17 DIAGNOSIS — Z79890 Hormone replacement therapy (postmenopausal): Secondary | ICD-10-CM

## 2024-02-17 DIAGNOSIS — A4189 Other specified sepsis: Secondary | ICD-10-CM | POA: Diagnosis not present

## 2024-02-17 DIAGNOSIS — R509 Fever, unspecified: Secondary | ICD-10-CM | POA: Diagnosis not present

## 2024-02-17 DIAGNOSIS — E860 Dehydration: Secondary | ICD-10-CM | POA: Diagnosis not present

## 2024-02-17 DIAGNOSIS — J449 Chronic obstructive pulmonary disease, unspecified: Secondary | ICD-10-CM | POA: Diagnosis not present

## 2024-02-17 DIAGNOSIS — R918 Other nonspecific abnormal finding of lung field: Secondary | ICD-10-CM | POA: Diagnosis not present

## 2024-02-17 DIAGNOSIS — I4891 Unspecified atrial fibrillation: Secondary | ICD-10-CM | POA: Diagnosis not present

## 2024-02-17 DIAGNOSIS — J1000 Influenza due to other identified influenza virus with unspecified type of pneumonia: Secondary | ICD-10-CM | POA: Diagnosis present

## 2024-02-17 DIAGNOSIS — R5383 Other fatigue: Secondary | ICD-10-CM | POA: Diagnosis not present

## 2024-02-17 DIAGNOSIS — R0902 Hypoxemia: Secondary | ICD-10-CM | POA: Diagnosis not present

## 2024-02-17 DIAGNOSIS — R6520 Severe sepsis without septic shock: Secondary | ICD-10-CM | POA: Diagnosis present

## 2024-02-17 DIAGNOSIS — E861 Hypovolemia: Secondary | ICD-10-CM | POA: Diagnosis not present

## 2024-02-17 DIAGNOSIS — R051 Acute cough: Secondary | ICD-10-CM | POA: Diagnosis not present

## 2024-02-17 DIAGNOSIS — Z03818 Encounter for observation for suspected exposure to other biological agents ruled out: Secondary | ICD-10-CM | POA: Diagnosis not present

## 2024-02-17 LAB — RESP PANEL BY RT-PCR (RSV, FLU A&B, COVID)  RVPGX2
Influenza A by PCR: POSITIVE — AB
Influenza B by PCR: NEGATIVE
Resp Syncytial Virus by PCR: NEGATIVE
SARS Coronavirus 2 by RT PCR: NEGATIVE

## 2024-02-17 LAB — LACTIC ACID, PLASMA: Lactic Acid, Venous: 1.6 mmol/L (ref 0.5–1.9)

## 2024-02-17 MED ORDER — METOPROLOL TARTRATE 25 MG PO TABS
25.0000 mg | ORAL_TABLET | Freq: Two times a day (BID) | ORAL | Status: DC
Start: 2024-02-17 — End: 2024-02-19
  Administered 2024-02-17 – 2024-02-19 (×4): 25 mg via ORAL
  Filled 2024-02-17 (×4): qty 1

## 2024-02-17 MED ORDER — ENOXAPARIN SODIUM 40 MG/0.4ML IJ SOSY
40.0000 mg | PREFILLED_SYRINGE | INTRAMUSCULAR | Status: DC
  Administered 2024-02-18 – 2024-02-19 (×2): 40 mg via SUBCUTANEOUS
  Filled 2024-02-17 (×2): qty 0.4

## 2024-02-17 MED ORDER — SODIUM CHLORIDE 0.9 % IV SOLN
2.0000 g | INTRAVENOUS | Status: DC
  Administered 2024-02-18: 2 g via INTRAVENOUS
  Filled 2024-02-17: qty 20

## 2024-02-17 MED ORDER — SODIUM CHLORIDE 0.9 % IV SOLN
500.0000 mg | INTRAVENOUS | Status: DC
  Administered 2024-02-18: 500 mg via INTRAVENOUS
  Filled 2024-02-17: qty 5

## 2024-02-17 MED ORDER — SODIUM CHLORIDE 0.9 % IV SOLN: INTRAVENOUS | Status: DC

## 2024-02-17 MED ORDER — OSELTAMIVIR PHOSPHATE 30 MG PO CAPS
30.0000 mg | ORAL_CAPSULE | Freq: Two times a day (BID) | ORAL | Status: DC
  Administered 2024-02-17: 30 mg via ORAL
  Filled 2024-02-17: qty 1

## 2024-02-17 MED ORDER — OSELTAMIVIR PHOSPHATE 75 MG PO CAPS: 75.0000 mg | ORAL_CAPSULE | Freq: Two times a day (BID) | ORAL | Status: DC

## 2024-02-17 MED ORDER — SODIUM CHLORIDE 0.9 % IV SOLN
500.0000 mg | Freq: Once | INTRAVENOUS | Status: AC
  Administered 2024-02-17: 500 mg via INTRAVENOUS
  Filled 2024-02-17: qty 5

## 2024-02-17 MED ORDER — SODIUM CHLORIDE 0.9 % IV SOLN
1.0000 g | Freq: Once | INTRAVENOUS | Status: AC
  Administered 2024-02-17: 1 g via INTRAVENOUS
  Filled 2024-02-17: qty 10

## 2024-02-17 NOTE — ED Notes (Addendum)
 Pt is tachycardic, in the 115-125 consistently with jumps to 130s, MD notified

## 2024-02-17 NOTE — ED Provider Notes (Signed)
 [place] EMERGENCY DEPARTMENT AT [facility] Provider Note   CSN: [id number] Arrival date & time: 02/17/24  1341     History Chief Complaint  Patient presents with   Shortness of Breath   Cough    HPI Malik Beck is a 78 y.o. male presenting for fever cough congestion x 3 days.  Acutely worsened today.  Went to PCP this morning and diagnosed with pneumonia sent to the emergency room due to hypoxia and tachycardia.  No history of similar.  He is otherwise ambulatory tolerating p.o. intake prior to this.  Patient's recorded medical, surgical, social, medication list and allergies were reviewed in the Snapshot window as part of the initial history.   Review of Systems   Review of Systems  Constitutional:  Positive for fever. Negative for chills.  HENT:  Positive for congestion. Negative for ear pain and sore throat.   Eyes:  Negative for pain and visual disturbance.  Respiratory:  Positive for cough. Negative for shortness of breath.   Cardiovascular:  Negative for chest pain and palpitations.  Gastrointestinal:  Negative for abdominal pain and vomiting.  Genitourinary:  Negative for dysuria and hematuria.  Musculoskeletal:  Negative for arthralgias and back pain.  Skin:  Negative for color change and rash.  Neurological:  Negative for seizures and syncope.  All other systems reviewed and are negative.   Physical Exam Updated Vital Signs BP (!) 152/85   Pulse (!) 107   Temp 99.1 F (37.3 C) (Oral)   Resp (!) 31   Ht 5\' 10"  (1.778 m)   Wt 61.2 kg   SpO2 96%   BMI 19.37 kg/m  Physical Exam Vitals and nursing note reviewed.  Constitutional:      General: She is not in acute distress.    Appearance: She is well-developed.  HENT:     Head: Normocephalic and atraumatic.  Eyes:     Conjunctiva/sclera: Conjunctivae normal.  Cardiovascular:     Rate and Rhythm: Normal rate and regular rhythm.     Heart sounds: No murmur heard. Pulmonary:     Effort:  Tachypnea and respiratory distress present.     Breath sounds: Normal breath sounds.  Abdominal:     General: There is no distension.     Palpations: Abdomen is soft.     Tenderness: There is no abdominal tenderness. There is no right CVA tenderness or left CVA tenderness.  Musculoskeletal:        General: No swelling or tenderness. Normal range of motion.     Cervical back: Neck supple.  Skin:    General: Skin is warm and dry.  Neurological:     General: No focal deficit present.     Mental Status: She is alert and oriented to person, place, and time. Mental status is at baseline.     Cranial Nerves: No cranial nerve deficit.      ED Course/ Medical Decision Making/ A&P    Procedures Procedures   Medications Ordered in ED Medications  cefTRIAXone (ROCEPHIN) 1 g in sodium chloride 0.9 % 100 mL IVPB (has no administration in time range)  azithromycin (ZITHROMAX) 500 mg in sodium chloride 0.9 % 250 mL IVPB (has no administration in time range)    Medical Decision Making:   This is a 78 year old male (wrong gender entered in patient's chart) presenting with a chief complaint of shortness of breath fever and cough productive of sputum. X-ray showed a large right sided middle lobe opacity concerning  for pneumonia. Rocephin and azithromycin administered, blood cultures drawn and patient required titration onto 3 L nasal cannula oxygen for acute hypoxic respiratory failure. He gradually stabilized as he was on oxygen and symptoms gradually improved. Ultimately patient arranged for admission for ongoing care and management.  Remainder of lab work drawn reviewed without focal pathology.  Disposition:   Based on the above findings, I believe this patient is stable for admission.    Patient/family educated about specific findings on our evaluation and explained exact reasons for admission.  Patient/family educated about clinical situation and time was allowed to answer questions.    Admission team communicated with and agreed with need for admission. Patient admitted. Patient ready to move at this time.     Emergency Department Medication Summary:   Medications  cefTRIAXone (ROCEPHIN) 1 g in sodium chloride 0.9 % 100 mL IVPB (has no administration in time range)  azithromycin (ZITHROMAX) 500 mg in sodium chloride 0.9 % 250 mL IVPB (has no administration in time range)        Clinical Impression:  1. Shortness of breath   2. Community acquired pneumonia of right middle lobe of lung      Data Unavailable   Final Clinical Impression(s) / ED Diagnoses Final diagnoses:  Shortness of breath  Community acquired pneumonia of right middle lobe of lung    Rx / DC Orders ED Discharge Orders     None         Glyn Ade, MD 02/17/24 [phone]

## 2024-02-17 NOTE — ED Provider Triage Note (Signed)
 Emergency Medicine Provider Triage Evaluation Note  Malik Beck , a 78 y.o. male  was evaluated in triage.  Pt complains of SOB, PNA, hypoxia.  Review of Systems  Positive:  Negative:   Physical Exam  There were no vitals taken for this visit. Gen:   Awake, no distress   Resp:  Normal effort  MSK:   Moves extremities without difficulty  Other:    Medical Decision Making  Medically screening exam initiated at 1:49 PM.  Appropriate orders placed.  Malik Beck was informed that the remainder of the evaluation will be completed by another provider, this initial triage assessment does not replace that evaluation, and the importance of remaining in the ED until their evaluation is complete.  Sent from UC for PNA and hypoxia. Patient with mild fever, cough, and SOB developing since last Friday. Denies nausea, vomiting, diarrhea.   Valrie Hart F, [place] 02/17/24 1350

## 2024-02-17 NOTE — H&P (Signed)
 History and Physical    Patient: Malik Beck OZH:[id number] DOB: 1946/07/07 DOA: 02/17/2024 DOS: the patient was seen and examined on 02/17/2024 PCP: Emilio Aspen, MD  Patient coming from: Home  Chief Complaint:  Chief Complaint  Patient presents with   Shortness of Breath   Cough   HPI: [name] is a 78 y.o. male with medical history significant of essential hypertension, hypothyroidism, osteoarthritis mammogram things who is relatively healthy coming from home with 3 to 4 days of cough shortness of breath and weakness.  Patient denied any fever.  He has been taking his medications as prescribed.  Patient noticed that he was unable to function well.  He is getting weaker every day.  Even to get out of bed was becoming difficult.  The cough was productive.  Stopping him from sleeping at night.  Patient came to the ER where he was evaluated.  He was found to be positive for influenza A as well as multifocal pneumonia.  He initially went to the urgent care center where he was evaluated and then sent over to the ER.  Chest x-ray did show right greater than left lung pneumonia.  He is being admitted for further evaluation and treatment  Review of Systems: As mentioned in the history of present illness. All other systems reviewed and are negative. History reviewed. No pertinent past medical history. History reviewed. No pertinent surgical history. Social History:  reports that she has never smoked. She has never used smokeless tobacco. She reports that she does not use drugs. No history on file for alcohol use.  Not on File  History reviewed. No pertinent family history.  Prior to Admission medications   Not on File    Physical Exam: Vitals:   02/17/24 1357 02/17/24 1359 02/17/24 1400 02/17/24 1515  BP:  137/60 120/62 (!) 152/85  Pulse: (!) 57 93 (!) 135 (!) 107  Resp: (!) 28 (!) 25 (!) 31 (!) 31  Temp: 99.1 F (37.3 C)     TempSrc: Oral     SpO2: 98% 99% 92% 96%   Weight:  61.2 kg    Height:  5\' 10"  (1.778 m)     Constitutional: Acutely ill looking, mild distress, calm, comfortable Eyes: PERRL, lids and conjunctivae normal ENMT: Mucous membranes are dry. Posterior pharynx clear of any exudate or lesions.Normal dentition.  Neck: normal, supple, no masses, no thyromegaly Respiratory: Decreased air entry bilaterally with some coarse breath sound and rhonchi, no accessory muscle use.  Cardiovascular: Sinus tachycardia, no murmurs / rubs / gallops. No extremity edema. 2+ pedal pulses. No carotid bruits.  Abdomen: no tenderness, no masses palpated. No hepatosplenomegaly. Bowel sounds positive.  Musculoskeletal: Good range of motion, no joint swelling or tenderness, Skin: no rashes, lesions, ulcers. No induration Neurologic: CN 2-12 grossly intact. Sensation intact, DTR normal. Strength 5/5 in all 4.  Psychiatric: Normal judgment and insight. Alert and oriented x 3. Normal mood  Data Reviewed:  Temperature is 99.1 blood pressure 152/85, pulse 107 respiratory rate of 31 oxygen sat 92% on 3 L.  He was 78% on room air, white count 14.2 platelets 260 sodium 126 potassium 3.0 chloride 93 CO2 of 19 creatinine 1.03 calcium 8.5, chest x-ray shows some multifocal pneumonia, viral screen is positive for influenza A.  Lactic acid is 1.6.  Assessment and Plan:  #1 sepsis due to pneumonia: Patient meets sepsis criteria with leukocytosis and tachycardia.  Also respiratory rate of 31.  He has confirmed multifocal pneumonia.  Patient will be admitted.  Initiate IV Rocephin and Zithromax.  Blood cultures obtained.  Can get sputum cultures if patient stops producing sputum.  Continue with other supportive care.  #2 hyponatremia: Patient appears dehydrated.  This could be from fluid and dehydration.  Poor oral intake.  Hydrate with saline.  #3 AKI: Prerenal most likely.  Hydrate and follow renal function.  #4 essential hypertension: Patient reports taking valsartan and  amlodipine.  Will hold valsartan due to AKI.  Amlodipine as needed.  #5 osteoarthritis: Patient takes hydroxychloroquine.  Will resume his home regimen.  #6 hypokalemia: Most likely due to dehydration.  Will replete potassium.  #7 multifocal pneumonia: Continue as per above  #8 hypothyroidism: Patient takes Synthroid.  Will resume     Advance Care Planning:   Code Status: Full Code   Consults: None  Family Communication: Wife  Severity of Illness: The appropriate patient status for this patient is INPATIENT. Inpatient status is judged to be reasonable and necessary in order to provide the required intensity of service to ensure the patient's safety. The patient's presenting symptoms, physical exam findings, and initial radiographic and laboratory data in the context of their chronic comorbidities is felt to place them at high risk for further clinical deterioration. Furthermore, it is not anticipated that the patient will be medically stable for discharge from the hospital within 2 midnights of admission.   * I certify that at the point of admission it is my clinical judgment that the patient will require inpatient hospital care spanning beyond 2 midnights from the point of admission due to high intensity of service, high risk for further deterioration and high frequency of surveillance required.*  AuthorLonia Blood, MD 02/17/2024 4:38 PM  For on call review [email or web address].

## 2024-02-17 NOTE — ED Notes (Signed)
 Pt desatted to 89-90% - put oxygen on 3.5 L with rebound to 94%

## 2024-02-17 NOTE — ED Triage Notes (Signed)
 Pt arrived via POV. C/o SOB for 5x days. Was seen at [facility] today dx w/pneumonia.  Tachy in 130s before N O2, down to 60 after  89 on RA, placed on 3L up to 96.

## 2024-02-18 DIAGNOSIS — J9601 Acute respiratory failure with hypoxia: Secondary | ICD-10-CM | POA: Diagnosis not present

## 2024-02-18 LAB — COMPREHENSIVE METABOLIC PANEL
ALT: 19 U/L (ref 0–44)
ALT: 21 U/L (ref 0–44)
AST: 34 U/L (ref 15–41)
AST: 44 U/L — ABNORMAL HIGH (ref 15–41)
Albumin: 2.4 g/dL — ABNORMAL LOW (ref 3.5–5.0)
Albumin: 3.1 g/dL — ABNORMAL LOW (ref 3.5–5.0)
Alkaline Phosphatase: 126 U/L (ref 38–126)
Alkaline Phosphatase: 92 U/L (ref 38–126)
Anion gap: 12 (ref 5–15)
Anion gap: 14 (ref 5–15)
BUN: 16 mg/dL (ref 8–23)
BUN: 16 mg/dL (ref 8–23)
CO2: 19 mmol/L — ABNORMAL LOW (ref 22–32)
CO2: 19 mmol/L — ABNORMAL LOW (ref 22–32)
Calcium: 7.8 mg/dL — ABNORMAL LOW (ref 8.9–10.3)
Calcium: 8.5 mg/dL — ABNORMAL LOW (ref 8.9–10.3)
Chloride: 93 mmol/L — ABNORMAL LOW (ref 98–111)
Chloride: 94 mmol/L — ABNORMAL LOW (ref 98–111)
Creatinine, Ser: 0.86 mg/dL (ref 0.61–1.24)
Creatinine, Ser: 1.03 mg/dL (ref 0.61–1.24)
GFR, Estimated: 56 mL/min — ABNORMAL LOW (ref >60)
GFR, Estimated: >60 mL/min (ref >60)
Glucose, Bld: 86 mg/dL (ref 70–99)
Glucose, Bld: 94 mg/dL (ref 70–99)
Potassium: 3.6 mmol/L (ref 3.5–5.1)
Potassium: 3.7 mmol/L (ref 3.5–5.1)
Sodium: 125 mmol/L — ABNORMAL LOW (ref 135–145)
Sodium: 126 mmol/L — ABNORMAL LOW (ref 135–145)
Total Bilirubin: 1.5 mg/dL — ABNORMAL HIGH (ref 0.0–1.2)
Total Bilirubin: 2 mg/dL — ABNORMAL HIGH (ref 0.0–1.2)
Total Protein: 5.5 g/dL — ABNORMAL LOW (ref 6.5–8.1)
Total Protein: 7.1 g/dL (ref 6.5–8.1)

## 2024-02-18 LAB — CBC WITH DIFFERENTIAL/PLATELET
Abs Immature Granulocytes: 0.23 10*3/uL — ABNORMAL HIGH (ref 0.00–0.07)
Basophils Absolute: 0.1 10*3/uL (ref 0.0–0.1)
Basophils Relative: 0 %
Eosinophils Absolute: 0 10*3/uL (ref 0.0–0.5)
Eosinophils Relative: 0 %
HCT: 39.3 % (ref 39.0–52.0)
Hemoglobin: 13.5 g/dL (ref 13.0–17.0)
Immature Granulocytes: 2 %
Lymphocytes Relative: 2 %
Lymphs Abs: 0.3 10*3/uL — ABNORMAL LOW (ref 0.7–4.0)
MCH: 30 pg (ref 26.0–34.0)
MCHC: 34.4 g/dL (ref 30.0–36.0)
MCV: 87.3 fL (ref 80.0–100.0)
Monocytes Absolute: 0.7 10*3/uL (ref 0.1–1.0)
Monocytes Relative: 5 %
Neutro Abs: 12.8 10*3/uL — ABNORMAL HIGH (ref 1.7–7.7)
Neutrophils Relative %: 91 %
Platelets: 260 10*3/uL (ref 150–400)
RBC: 4.5 MIL/uL (ref 4.22–5.81)
RDW: 13.2 % (ref 11.5–15.5)
WBC: 14.2 10*3/uL — ABNORMAL HIGH (ref 4.0–10.5)
nRBC: 0 % (ref 0.0–0.2)

## 2024-02-18 LAB — LACTIC ACID, PLASMA: Lactic Acid, Venous: 1 mmol/L (ref 0.5–1.9)

## 2024-02-18 LAB — HIV ANTIBODY (ROUTINE TESTING W REFLEX): HIV Screen 4th Generation wRfx: NONREACTIVE

## 2024-02-18 LAB — CBC
HCT: 34.4 % — ABNORMAL LOW (ref 39.0–52.0)
Hemoglobin: 11.5 g/dL — ABNORMAL LOW (ref 13.0–17.0)
MCH: 29.9 pg (ref 26.0–34.0)
MCHC: 33.4 g/dL (ref 30.0–36.0)
MCV: 89.6 fL (ref 80.0–100.0)
Platelets: 213 10*3/uL (ref 150–400)
RBC: 3.84 MIL/uL — ABNORMAL LOW (ref 4.22–5.81)
RDW: 13.4 % (ref 11.5–15.5)
WBC: 12.4 10*3/uL — ABNORMAL HIGH (ref 4.0–10.5)
nRBC: 0 % (ref 0.0–0.2)

## 2024-02-18 MED ORDER — OSELTAMIVIR PHOSPHATE 75 MG PO CAPS
75.0000 mg | ORAL_CAPSULE | Freq: Two times a day (BID) | ORAL | Status: DC
  Administered 2024-02-18: 75 mg via ORAL
  Filled 2024-02-18: qty 1

## 2024-02-18 NOTE — Plan of Care (Signed)
   Problem: Education: Goal: Knowledge of General Education information will improve Description: Including pain rating scale, medication(s)/side effects and non-pharmacologic comfort measures Outcome: Progressing   Problem: Safety: Goal: Ability to remain free from injury will improve Outcome: Progressing

## 2024-02-18 NOTE — Progress Notes (Signed)
 PROGRESS NOTE    Malik Beck  QMV:[id number] DOB: 07-Jan-1946 DOA: 02/17/2024 PCP: Emilio Aspen, MD   Brief Narrative:  Malik Beck is a 78 y.o. male with medical history significant of essential hypertension, hypothyroidism, osteoarthritis mammogram things who is relatively healthy coming from home with worsening cough shortness of breath and weakness for 4 days.  Found to be hypoxic at intake, imaging concerning for pneumonia, UA positive.  Hospitalist for admission.    Assessment & Plan:   Principal Problem:   Acute hypoxic respiratory failure (HCC) Active Problems:   Hyponatremia   Influenza A with respiratory manifestations   AKI (acute kidney injury) (HCC)   Leucocytosis   Multifocal pneumonia   Hypothyroidism   Osteoarthritis   Essential hypertension   Severe sepsis due to pneumonia Influenza a positive Acute hypoxic respiratory failure -Patient meets sepsis criteria with leukocytosis tachypnea and tachycardia without source of pneumonia, multifocal -Influenza A+ however given multifocal nature likely bacterial in nature, potentially postviral.  Continue broad-spectrum coverage as below with azithromycin/ceftriaxone, no indication for Tamiflu given timing  Questionable new diagnosis A-fib  -EKG confirms A-fib, rate controlled -Continue to follow, hold anticoagulation in the interim -If patient converts to sinus rhythm as pneumonia resolves no indication for anticoagulation  hypovolemic hyponatremia: Sodium baseline 120s AKI: Prerenal - continue to advance diet as tolerated   Essential hypertension: Home medications on hold(valsartan and amlodipine) Osteoarthritis: Continue hydroxychloroquine. Hypokalemia: Most likely due to dehydration.  Will replete potassium. Multifocal pneumonia: Continue as per above   #8 hypothyroidism: Patient takes Synthroid.  Will resume   DVT prophylaxis: enoxaparin (LOVENOX) injection 40 mg Start: 02/18/24  1000   Code Status:   Code Status: Full Code  Family Communication: At bedside  Status is: Inpatient  Dispo: The patient is from: Home              Anticipated d/c is to: Home              Anticipated d/c date is: 24 to 48 hours              Patient currently not medically stable for discharge  Consultants:  None  Procedures:  None  Antimicrobials:  Ceftriaxone, azithromycin x 5 days  Subjective: No acute issues or events overnight denies nausea vomiting diarrhea constipation headache fevers chills or chest pain.  Not yet resolved, ambulating today without profound symptoms  Objective: Vitals:   02/17/24 2200 02/17/24 2316 02/18/24 0325 02/18/24 0700  BP: 111/80 (!) 147/62 122/78 117/85  Pulse: 95 80 88 99  Resp: (!) 24 20 18    Temp: 98.6 F (37 C) 98.6 F (37 C) 98.6 F (37 C) 98.1 F (36.7 C)  TempSrc: Oral Oral Oral Oral  SpO2: 93% 98% 94% 99%  Weight:      Height:        Intake/Output Summary (Last 24 hours) at 02/18/2024 0800 Last data filed at 02/17/2024 1706 Gross per 24 hour  Intake 100 ml  Output --  Net 100 ml   Filed Weights   02/17/24 1359  Weight: 61.2 kg    Examination:  General:  Pleasantly resting in bed, No acute distress. HEENT:  Normocephalic atraumatic.  Sclerae nonicteric, noninjected.  Extraocular movements intact bilaterally. Neck:  Without mass or deformity.  Trachea is midline. Lungs:  Clear to auscultate bilaterally without rhonchi, wheeze, or rales. Heart:  Regular rate and rhythm.  Without murmurs, rubs, or gallops. Abdomen:  Soft, nontender, nondistended.  Without guarding  or rebound. Extremities: Without cyanosis, clubbing, edema, or obvious deformity. Skin:  Warm and dry, no erythema.  Data Reviewed: I have personally reviewed following labs and imaging studies  CBC: Recent Labs  Lab 02/17/24 1420 02/18/24 0032  WBC 14.2* 12.4*  NEUTROABS 12.8*  --   HGB 13.5 11.5*  HCT 39.3 34.4*  MCV 87.3 89.6  PLT 260 213    Basic Metabolic Panel: Recent Labs  Lab 02/17/24 1420 02/18/24 0032  NA 126* 125*  K 3.7 3.6  CL 93* 94*  CO2 19* 19*  GLUCOSE 86 94  BUN 16 16  CREATININE 1.03 0.86  CALCIUM 8.5* 7.8*   GFR: Estimated Creatinine Clearance: 62.3 mL/min (by C-G formula based on SCr of 0.86 mg/dL). Liver Function Tests: Recent Labs  Lab 02/17/24 1420 02/18/24 0032  AST 44* 34  ALT 21 19  ALKPHOS 126 92  BILITOT 2.0* 1.5*  PROT 7.1 5.5*  ALBUMIN 3.1* 2.4*   Sepsis Labs: Recent Labs  Lab 02/17/24 1445 02/18/24 0032  LATICACIDVEN 1.6 1.0    Recent Results (from the past 240 hours)  Resp panel by RT-PCR (RSV, Flu A&B, Covid)     Status: Abnormal   Collection Time: 02/17/24  3:10 PM   Specimen: Nasal Swab  Result Value Ref Range Status   SARS Coronavirus 2 by RT PCR NEGATIVE NEGATIVE Final    Comment: (NOTE) SARS-CoV-2 target nucleic acids are NOT DETECTED.  The SARS-CoV-2 RNA is generally detectable in upper respiratory specimens during the acute phase of infection. The lowest concentration of SARS-CoV-2 viral copies this assay can detect is 138 copies/mL. A negative result does not preclude SARS-Cov-2 infection and should not be used as the sole basis for treatment or other patient management decisions. A negative result may occur with  improper specimen collection/handling, submission of specimen other than nasopharyngeal swab, presence of viral mutation(s) within the areas targeted by this assay, and inadequate number of viral copies(<138 copies/mL). A negative result must be combined with clinical observations, patient history, and epidemiological information. The expected result is Negative.  Fact Sheet for Patients:  BloggerCourse.com  Fact Sheet for Healthcare Providers:  SeriousBroker.it  This test is no t yet approved or cleared by the [place] FDA and  has been authorized for detection and/or diagnosis of  SARS-CoV-2 by FDA under an Emergency Use Authorization (EUA). This EUA will remain  in effect (meaning this test can be used) for the duration of the COVID-19 declaration under Section 564(b)(1) of the Act, 21 U.S.C.section 360bbb-3(b)(1), unless the authorization is terminated  or revoked sooner.       Influenza A by PCR POSITIVE (A) NEGATIVE Final   Influenza B by PCR NEGATIVE NEGATIVE Final    Comment: (NOTE) The Xpert Xpress SARS-CoV-2/FLU/RSV plus assay is intended as an aid in the diagnosis of influenza from Nasopharyngeal swab specimens and should not be used as a sole basis for treatment. Nasal washings and aspirates are unacceptable for Xpert Xpress SARS-CoV-2/FLU/RSV testing.  Fact Sheet for Patients: BloggerCourse.com  Fact Sheet for Healthcare Providers: SeriousBroker.it  This test is not yet approved or cleared by the [place] FDA and has been authorized for detection and/or diagnosis of SARS-CoV-2 by FDA under an Emergency Use Authorization (EUA). This EUA will remain in effect (meaning this test can be used) for the duration of the COVID-19 declaration under Section 564(b)(1) of the Act, 21 U.S.C. section 360bbb-3(b)(1), unless the authorization is terminated or revoked.     Resp Syncytial  Virus by PCR NEGATIVE NEGATIVE Final    Comment: (NOTE) Fact Sheet for Patients: BloggerCourse.com  Fact Sheet for Healthcare Providers: SeriousBroker.it  This test is not yet approved or cleared by the [place] FDA and has been authorized for detection and/or diagnosis of SARS-CoV-2 by FDA under an Emergency Use Authorization (EUA). This EUA will remain in effect (meaning this test can be used) for the duration of the COVID-19 declaration under Section 564(b)(1) of the Act, 21 U.S.C. section 360bbb-3(b)(1), unless the authorization is terminated  or revoked.  Performed at [facility], [address], [place], [place] [address]          Radiology Studies: DG Chest 2 View Result Date: 02/17/2024 CLINICAL DATA:  Hypoxia EXAM: CHEST - 2 VIEW COMPARISON:  None Available. FINDINGS: The heart size and mediastinal contours are within normal limits. The lungs are hyperinflated with evidence of emphysematous lung disease. Mild to moderate severity bibasilar and right upper lobe infiltrates are seen. No pleural effusion or pneumothorax is identified. Multilevel degenerative changes seen throughout the thoracic spine. IMPRESSION: 1. Mild to moderate severity bibasilar and right upper lobe infiltrates. 2. COPD. Electronically Signed   By: Aram Candela M.D.   On: 02/17/2024 18:18        Scheduled Meds:  enoxaparin (LOVENOX) injection  40 mg Subcutaneous Q24H   metoprolol tartrate  25 mg Oral BID   oseltamivir  75 mg Oral BID   Continuous Infusions:  sodium chloride 100 mL/hr at 02/17/24 2338   azithromycin     cefTRIAXone (ROCEPHIN)  IV       LOS: 1 day   Time spent:  Azucena Fallen, DO [facility]  If 7PM-7AM, please contact night-coverage [email or web address]  02/18/2024, 8:00 AM

## 2024-02-19 ENCOUNTER — Other Ambulatory Visit (HOSPITAL_COMMUNITY): Payer: Self-pay

## 2024-02-19 DIAGNOSIS — J9601 Acute respiratory failure with hypoxia: Secondary | ICD-10-CM | POA: Diagnosis not present

## 2024-02-19 MED ORDER — AZITHROMYCIN 250 MG PO TABS
250.0000 mg | ORAL_TABLET | Freq: Every day | ORAL | Status: DC
  Administered 2024-02-19: 250 mg via ORAL
  Filled 2024-02-19: qty 1

## 2024-02-19 MED ORDER — AZITHROMYCIN 250 MG PO TABS
ORAL_TABLET | ORAL | 0 refills | Status: DC
  Filled 2024-02-19: qty 2, 2d supply, fill #0

## 2024-02-19 MED ORDER — METOPROLOL TARTRATE 25 MG PO TABS
25.0000 mg | ORAL_TABLET | Freq: Two times a day (BID) | ORAL | 0 refills | Status: DC
  Filled 2024-02-19: qty 60, 30d supply, fill #0

## 2024-02-19 NOTE — Plan of Care (Signed)
  Problem: Education: Goal: Knowledge of General Education information will improve Description: Including pain rating scale, medication(s)/side effects and non-pharmacologic comfort measures Outcome: Progressing   Problem: Coping: Goal: Level of anxiety will decrease Outcome: Progressing   Problem: Elimination: Goal: Will not experience complications related to bowel motility Outcome: Progressing   

## 2024-02-19 NOTE — Progress Notes (Signed)
 Pt ambulated in hallway on room air.  Tolerated well.  Oxygen sat between 92-96%, HR between low 90's to low 100's.

## 2024-02-19 NOTE — Discharge Summary (Signed)
 Physician Discharge Summary  Malik Beck ZOX:[id number] DOB: [birth year] DOA: 02/17/2024  PCP: Emilio Aspen, MD  Admit date: 02/17/2024 Discharge date: 02/19/2024  Admitted From: Home Disposition: Home  Recommendations for Outpatient Follow-up:  Follow up with PCP in 1-2 weeks  Home Health: None Equipment/Devices: None  Discharge Condition: Stable CODE STATUS: Full Diet recommendation: Low-salt low-fat diet  Brief/Interim Summary: Malik Beck is a 78 y.o. male with medical history significant of essential hypertension, hypothyroidism, osteoarthritis mammogram things who is relatively healthy coming from home with worsening cough shortness of breath and weakness for 4 days.  Found to be hypoxic at intake, imaging concerning for pneumonia, UA positive.  Hospitalist for admission.    Patient had acute hypoxic respiratory failure in setting of severe sepsis and pneumonia with influenza positive test.  Patient's respiratory status continued to improve and is no longer hypoxic at rest or with exertion.  He is otherwise stable and agreeable for discharge home. Patient noted to have what appears to be new onset afib -has no history of palpitations.  In light of patient's A-fib that is rate controlled without palpitations/symptoms or any history of A-fib, we had a lengthy discussion with patient and wife at bedside.  We discussed initiating anticoagulation now following up closely with PCP in the next few days and monitoring heart rate at home before starting anticoagulation.  Will send prescription for Eliquis to patient's pharmacy, recommend patient to continue to monitor heart rate with PCP and potentially repeat EKG once diagnosis resolved which would confirm provoked afib. Discussed ambulatory heart rate monitor as well as a potential option to monitor heart rate.  Discharge Diagnoses:  Principal Problem:   Acute hypoxic respiratory failure (HCC) Active Problems:    Hyponatremia   Influenza A with respiratory manifestations   AKI (acute kidney injury) (HCC)   Leucocytosis   Multifocal pneumonia   Hypothyroidism   Osteoarthritis   Essential hypertension  Severe sepsis due to pneumonia Influenza a positive Acute hypoxic respiratory failure - resolved -Patient meets sepsis criteria with leukocytosis tachypnea and tachycardia without source of pneumonia, multifocal -Influenza A+ however given multifocal nature likely bacterial in nature, potentially postviral.  -Continue azithromycin at discharge complete course -No indication for Tamiflu given timing   Questionable new diagnosis A-fib  -EKG confirms A-fib, rate controlled off medications -Discussed monitoring and discussing with PCP on Monday -Eliquis prescription sent as discussed above   Hypovolemic hyponatremia: Sodium baseline 120s -at baseline AKI: Resolved prerenal - continue to advance diet as tolerated Essential hypertension: Home medications on hold(valsartan and amlodipine) Osteoarthritis: Continue hydroxychloroquine. Hypokalemia: Most likely due to dehydration.  Will replete potassium. Multifocal pneumonia: Continue as per above Hypothyroidism: Continue synthroid  Discharge Instructions  Discharge Instructions     Call MD for:  difficulty breathing, headache or visual disturbances   Complete by: As directed    Call MD for:  extreme fatigue   Complete by: As directed    Call MD for:  hives   Complete by: As directed    Call MD for:  persistant dizziness or light-headedness   Complete by: As directed    Call MD for:  persistant nausea and vomiting   Complete by: As directed    Call MD for:  severe uncontrolled pain   Complete by: As directed    Call MD for:  temperature >100.4   Complete by: As directed    Diet - low sodium heart healthy   Complete by: As directed  Diet Carb Modified   Complete by: As directed    Increase activity slowly   Complete by: As directed        Allergies as of 02/19/2024   No Known Allergies      Medication List     STOP taking these medications    amLODipine 5 MG tablet Commonly known as: NORVASC   valsartan 160 MG tablet Commonly known as: DIOVAN       TAKE these medications    azithromycin 250 MG tablet Commonly known as: ZITHROMAX Take 1 tablet daily until completed Start taking on: February 20, 2024   guaiFENesin 600 MG 12 hr tablet Commonly known as: MUCINEX Take 1,200 mg by mouth 2 (two) times daily as needed for cough or to loosen phlegm.   hydroxychloroquine 200 MG tablet Commonly known as: PLAQUENIL Take 200 mg by mouth daily.   ibuprofen 200 MG tablet Commonly known as: ADVIL Take 200-400 mg by mouth every 6 (six) hours as needed for fever, headache or mild pain (pain score 1-3).   metoprolol tartrate 25 MG tablet Commonly known as: LOPRESSOR Take 1 tablet (25 mg total) by mouth 2 (two) times daily.   Synthroid 125 MCG tablet Generic drug: levothyroxine Take 125 mcg by mouth every morning.        Follow-up Information     Schedule an appointment as soon as possible for a visit  with Connect with your PCP/Specialist as discussed.   Contact information: [email or web address] Call our physician referral line at [phone].               No Known Allergies  Consultations: None  Procedures/Studies: DG Chest 2 View Result Date: 02/17/2024 CLINICAL DATA:  Hypoxia EXAM: CHEST - 2 VIEW COMPARISON:  None Available. FINDINGS: The heart size and mediastinal contours are within normal limits. The lungs are hyperinflated with evidence of emphysematous lung disease. Mild to moderate severity bibasilar and right upper lobe infiltrates are seen. No pleural effusion or pneumothorax is identified. Multilevel degenerative changes seen throughout the thoracic spine. IMPRESSION: 1. Mild to moderate severity bibasilar and right upper lobe infiltrates. 2. COPD.  Electronically Signed   By: Aram Candela M.D.   On: 02/17/2024 18:18     Subjective: No acute issues or events noted - denies nausea vomiting diarrhea constipation headache fevers chest pain palpitations or shortness of breath   Discharge Exam: Vitals:   02/19/24 0737 02/19/24 0830  BP:    Pulse:    Resp:    Temp:    SpO2: 95% 95%   Vitals:   02/19/24 0649 02/19/24 0710 02/19/24 0737 02/19/24 0830  BP: 128/86     Pulse: 95     Resp: 20     Temp: 98.1 F (36.7 C)     TempSrc: Oral     SpO2: 98% 98% 95% 95%  Weight:      Height:        General: Pt is alert, awake, not in acute distress Cardiovascular: RRR, S1/S2 +, no rubs, no gallops Respiratory: CTA bilaterally, no wheezing, no rhonchi Abdominal: Soft, NT, ND, bowel sounds + Extremities: no edema, no cyanosis    The results of significant diagnostics from this hospitalization (including imaging, microbiology, ancillary and laboratory) are listed below for reference.     Microbiology: Recent Results (from the past 240 hours)  Blood culture (routine x 2)     Status: None (Preliminary result)   Collection Time: 02/17/24  3:09 PM   Specimen:  BLOOD  Result Value Ref Range Status   Specimen Description   Final    BLOOD SITE NOT SPECIFIED Performed at [facility], [address], [place], [place] [address]    Special Requests   Final    BOTTLES DRAWN AEROBIC AND ANAEROBIC Blood Culture results may not be optimal due to an inadequate volume of blood received in culture bottles Performed at [facility], [address], [place], [place] [address]    Culture   Final    NO GROWTH < 24 HOURS Performed at [facility], [address], [place], [place] [address]    Report Status PENDING  Incomplete  Resp panel by RT-PCR (RSV, Flu A&B, Covid)     Status: Abnormal   Collection Time: 02/17/24  3:10 PM   Specimen: Nasal Swab  Result Value Ref Range Status   SARS  Coronavirus 2 by RT PCR NEGATIVE NEGATIVE Final    Comment: (NOTE) SARS-CoV-2 target nucleic acids are NOT DETECTED.  The SARS-CoV-2 RNA is generally detectable in upper respiratory specimens during the acute phase of infection. The lowest concentration of SARS-CoV-2 viral copies this assay can detect is 138 copies/mL. A negative result does not preclude SARS-Cov-2 infection and should not be used as the sole basis for treatment or other patient management decisions. A negative result may occur with  improper specimen collection/handling, submission of specimen other than nasopharyngeal swab, presence of viral mutation(s) within the areas targeted by this assay, and inadequate number of viral copies(<138 copies/mL). A negative result must be combined with clinical observations, patient history, and epidemiological information. The expected result is Negative.  Fact Sheet for Patients:  BloggerCourse.com  Fact Sheet for Healthcare Providers:  SeriousBroker.it  This test is no t yet approved or cleared by the [place] FDA and  has been authorized for detection and/or diagnosis of SARS-CoV-2 by FDA under an Emergency Use Authorization (EUA). This EUA will remain  in effect (meaning this test can be used) for the duration of the COVID-19 declaration under Section 564(b)(1) of the Act, 21 U.S.C.section 360bbb-3(b)(1), unless the authorization is terminated  or revoked sooner.       Influenza A by PCR POSITIVE (A) NEGATIVE Final   Influenza B by PCR NEGATIVE NEGATIVE Final    Comment: (NOTE) The Xpert Xpress SARS-CoV-2/FLU/RSV plus assay is intended as an aid in the diagnosis of influenza from Nasopharyngeal swab specimens and should not be used as a sole basis for treatment. Nasal washings and aspirates are unacceptable for Xpert Xpress SARS-CoV-2/FLU/RSV testing.  Fact Sheet for  Patients: BloggerCourse.com  Fact Sheet for Healthcare Providers: SeriousBroker.it  This test is not yet approved or cleared by the [place] FDA and has been authorized for detection and/or diagnosis of SARS-CoV-2 by FDA under an Emergency Use Authorization (EUA). This EUA will remain in effect (meaning this test can be used) for the duration of the COVID-19 declaration under Section 564(b)(1) of the Act, 21 U.S.C. section 360bbb-3(b)(1), unless the authorization is terminated or revoked.     Resp Syncytial Virus by PCR NEGATIVE NEGATIVE Final    Comment: (NOTE) Fact Sheet for Patients: BloggerCourse.com  Fact Sheet for Healthcare Providers: SeriousBroker.it  This test is not yet approved or cleared by the [place] FDA and has been authorized for detection and/or diagnosis of SARS-CoV-2 by FDA under an Emergency Use Authorization (EUA). This EUA will remain in effect (meaning this test can be used) for the duration of  the COVID-19 declaration under Section 564(b)(1) of the Act, 21 U.S.C. section 360bbb-3(b)(1), unless the authorization is terminated or revoked.  Performed at [facility], [address] [place], [place] [address]   Blood culture (routine x 2)     Status: None (Preliminary result)   Collection Time: 02/17/24  5:05 PM   Specimen: BLOOD  Result Value Ref Range Status   Specimen Description   Final    BLOOD SITE NOT SPECIFIED Performed at [facility], [address] [place], [place] [address]    Special Requests   Final    BOTTLES DRAWN AEROBIC AND ANAEROBIC Blood Culture results may not be optimal due to an inadequate volume of blood received in culture bottles Performed at [facility], [address] [place], [place] [address]    Culture   Final    NO GROWTH < 24 HOURS Performed at [facility], [address] [place], [place] [address]    Report Status PENDING  Incomplete     Labs: BNP (last 3 results) No results for input(s): "BNP" in the last 8760 hours. Basic Metabolic Panel: Recent Labs  Lab 02/17/24 1420 02/18/24 0032  NA 126* 125*  K 3.7 3.6  CL 93* 94*  CO2 19* 19*  GLUCOSE 86 94  BUN 16 16  CREATININE 1.03 0.86  CALCIUM 8.5* 7.8*   Liver Function Tests: Recent Labs  Lab 02/17/24 1420 02/18/24 0032  AST 44* 34  ALT 21 19  ALKPHOS 126 92  BILITOT 2.0* 1.5*  PROT 7.1 5.5*  ALBUMIN 3.1* 2.4*   CBC: Recent Labs  Lab 02/17/24 1420 02/18/24 0032  WBC 14.2* 12.4*  NEUTROABS 12.8*  --   HGB 13.5 11.5*  HCT 39.3 34.4*  MCV 87.3 89.6  PLT 260 213   Sepsis Labs Recent Labs  Lab 02/17/24 1420 02/18/24 0032  WBC 14.2* 12.4*   Microbiology Recent Results (from the past 240 hours)  Blood culture (routine x 2)     Status: None (Preliminary result)   Collection Time: 02/17/24  3:09 PM   Specimen: BLOOD  Result Value Ref Range Status   Specimen Description   Final    BLOOD SITE NOT SPECIFIED Performed at [facility], [address] [place], [place] [address]    Special Requests   Final    BOTTLES DRAWN AEROBIC AND ANAEROBIC Blood Culture results may not be optimal due to an inadequate volume of blood received in culture bottles Performed at [facility], [address] [place], [place] [address]    Culture   Final    NO GROWTH < 24 HOURS Performed at [facility], [address], [place], [place] [address]    Report Status PENDING  Incomplete  Resp panel by RT-PCR (RSV, Flu A&B, Covid)     Status: Abnormal   Collection Time: 02/17/24  3:10 PM   Specimen: Nasal Swab  Result Value Ref Range Status   SARS Coronavirus 2 by RT PCR NEGATIVE NEGATIVE Final    Comment: (NOTE) SARS-CoV-2 target nucleic acids are NOT DETECTED.  The SARS-CoV-2 RNA is generally detectable in upper  respiratory specimens during the acute phase of infection. The lowest concentration of SARS-CoV-2 viral copies this assay can detect is 138 copies/mL. A negative result does not preclude SARS-Cov-2 infection and should not be used as the sole basis for treatment or other patient management decisions. A negative result may occur  with  improper specimen collection/handling, submission of specimen other than nasopharyngeal swab, presence of viral mutation(s) within the areas targeted by this assay, and inadequate number of viral copies(<138 copies/mL). A negative result must be combined with clinical observations, patient history, and epidemiological information. The expected result is Negative.  Fact Sheet for Patients:  BloggerCourse.com  Fact Sheet for Healthcare Providers:  SeriousBroker.it  This test is no t yet approved or cleared by the [place] FDA and  has been authorized for detection and/or diagnosis of SARS-CoV-2 by FDA under an Emergency Use Authorization (EUA). This EUA will remain  in effect (meaning this test can be used) for the duration of the COVID-19 declaration under Section 564(b)(1) of the Act, 21 U.S.C.section 360bbb-3(b)(1), unless the authorization is terminated  or revoked sooner.       Influenza A by PCR POSITIVE (A) NEGATIVE Final   Influenza B by PCR NEGATIVE NEGATIVE Final    Comment: (NOTE) The Xpert Xpress SARS-CoV-2/FLU/RSV plus assay is intended as an aid in the diagnosis of influenza from Nasopharyngeal swab specimens and should not be used as a sole basis for treatment. Nasal washings and aspirates are unacceptable for Xpert Xpress SARS-CoV-2/FLU/RSV testing.  Fact Sheet for Patients: BloggerCourse.com  Fact Sheet for Healthcare Providers: SeriousBroker.it  This test is not yet approved or cleared by the [place] FDA and has been  authorized for detection and/or diagnosis of SARS-CoV-2 by FDA under an Emergency Use Authorization (EUA). This EUA will remain in effect (meaning this test can be used) for the duration of the COVID-19 declaration under Section 564(b)(1) of the Act, 21 U.S.C. section 360bbb-3(b)(1), unless the authorization is terminated or revoked.     Resp Syncytial Virus by PCR NEGATIVE NEGATIVE Final    Comment: (NOTE) Fact Sheet for Patients: BloggerCourse.com  Fact Sheet for Healthcare Providers: SeriousBroker.it  This test is not yet approved or cleared by the [place] FDA and has been authorized for detection and/or diagnosis of SARS-CoV-2 by FDA under an Emergency Use Authorization (EUA). This EUA will remain in effect (meaning this test can be used) for the duration of the COVID-19 declaration under Section 564(b)(1) of the Act, 21 U.S.C. section 360bbb-3(b)(1), unless the authorization is terminated or revoked.  Performed at [facility], [address] [place], [place] [address]   Blood culture (routine x 2)     Status: None (Preliminary result)   Collection Time: 02/17/24  5:05 PM   Specimen: BLOOD  Result Value Ref Range Status   Specimen Description   Final    BLOOD SITE NOT SPECIFIED Performed at [facility], [address], [place], [place] [address]    Special Requests   Final    BOTTLES DRAWN AEROBIC AND ANAEROBIC Blood Culture results may not be optimal due to an inadequate volume of blood received in culture bottles Performed at [facility], [address] [place], [place] [address]    Culture   Final    NO GROWTH < 24 HOURS Performed at [facility], [address] [place], [place] [address]    Report Status PENDING  Incomplete     Time coordinating discharge: Over 30 minutes  SIGNED:   Azucena Fallen, DO [facility] 02/19/2024, 12:49 PM Pager   If 7PM-7AM, please contact night-coverage [email or web address]

## 2024-02-22 ENCOUNTER — Encounter: Payer: Self-pay | Admitting: Radiation Oncology

## 2024-02-22 LAB — CULTURE, BLOOD (ROUTINE X 2)
Culture: NO GROWTH
Culture: NO GROWTH

## 2024-03-02 DIAGNOSIS — I4891 Unspecified atrial fibrillation: Secondary | ICD-10-CM | POA: Diagnosis not present

## 2024-03-02 DIAGNOSIS — E039 Hypothyroidism, unspecified: Secondary | ICD-10-CM | POA: Diagnosis not present

## 2024-03-02 DIAGNOSIS — D72829 Elevated white blood cell count, unspecified: Secondary | ICD-10-CM | POA: Diagnosis not present

## 2024-03-11 ENCOUNTER — Encounter: Payer: Self-pay | Admitting: Cardiology

## 2024-03-11 ENCOUNTER — Ambulatory Visit: Attending: Cardiology | Admitting: Cardiology

## 2024-03-11 VITALS — BP 150/90 | HR 58 | Ht 70.0 in | Wt 132.2 lb

## 2024-03-11 DIAGNOSIS — I480 Paroxysmal atrial fibrillation: Secondary | ICD-10-CM | POA: Diagnosis not present

## 2024-03-11 DIAGNOSIS — I10 Essential (primary) hypertension: Secondary | ICD-10-CM | POA: Diagnosis not present

## 2024-03-11 MED ORDER — AMLODIPINE BESYLATE 5 MG PO TABS: 5.0000 mg | ORAL_TABLET | Freq: Every day | ORAL | 3 refills | Status: DC

## 2024-03-11 MED ORDER — VALSARTAN 160 MG PO TABS: 160.0000 mg | ORAL_TABLET | Freq: Every day | ORAL | 3 refills | Status: DC

## 2024-03-11 NOTE — Patient Instructions (Addendum)
 Medication Instructions:  Stop Metoprolol  Take Amlodipine 5 mg daily Take Valsartan 160 mg daily Continue all other medications *If you need a refill on your cardiac medications before your next appointment, please call your pharmacy*  Lab Work: None ordered  Testing/Procedures: Echo  first available   Follow-Up: At [facility], you and your health needs are our priority.  As part of our continuing mission to provide you with exceptional heart care, our providers are all part of one team.  This team includes your primary Cardiologist (physician) and Advanced Practice Providers or APPs (Physician Assistants and Nurse Practitioners) who all work together to provide you with the care you need, when you need it.  Your next appointment:  To Be Determined    Provider:  Dr.Jordan   We recommend signing up for the patient portal called "MyChart".  Sign up information is provided on this After Visit Summary.  MyChart is used to connect with patients for Virtual Visits (Telemedicine).  Patients are able to view lab/test results, encounter notes, upcoming appointments, etc.  Non-urgent messages can be sent to your provider as well.   To learn more about what you can do with MyChart, go to [email or web address].         1st Floor: - Lobby - Registration  - Pharmacy  - Lab - Cafe  2nd Floor: - PV Lab - Diagnostic Testing (echo, CT, nuclear med)  3rd Floor: - Vacant  4th Floor: - TCTS (cardiothoracic surgery) - AFib Clinic - Structural Heart Clinic - Vascular Surgery  - Vascular Ultrasound  5th Floor: - HeartCare Cardiology (general and EP) - Clinical Pharmacy for coumadin, hypertension, lipid, weight-loss medications, and med management appointments    Valet parking services will be available as well.

## 2024-03-11 NOTE — Progress Notes (Signed)
 Cardiology Office Note:    Date:  03/11/2024   ID:  Malik, Beck October 28, 1946, MRN [id number]  PCP:  Emilio Aspen, MD   [facility] Providers Cardiologist:  None     Referring MD: Emilio Aspen, *   Chief Complaint  Patient presents with   Atrial Fibrillation    History of Present Illness:    Malik Beck is a 78 y.o. male is seen at the request of Dr [name] for evaluation of atrial fibrillation. He was admitted 3/5-02/19/24 with respiratory failure due to sepsis/PNA. He was + for influenza. He was found to be in Afib with controlled. Rate. He was not anticoagulated. BP medications changed.  No Echo done. Arranged follow up with [place].   He states he still feels weak and has hoarseness and fullness in his ears. He denies any palpitations. He has been very athletic and enjoys biking and hiking. He reports in the past his BP was well controlled on amlodipine and valsartan. These were held in hospital in favor of metoprolol. His BP has been running higher.   Past Medical History:  Diagnosis Date   Arthritis    1 finger   Asthma    childhood asthma none now   Cancer (HCC)    nhl, prostate ca   Hypothyroidism    Prostate cancer [facility]) 2009   Skin cancer April 2012    Past Surgical History:  Procedure Laterality Date   big toe surgery  yrs ago   COLONOSCOPY WITH PROPOFOL N/A 05/04/2017   Procedure: COLONOSCOPY WITH PROPOFOL;  Surgeon: Charolett Bumpers, MD;  Location: [facility];  Service: Endoscopy;  Laterality: N/A;   colonscopy  yrs ago   NASAL SEPTUM SURGERY  yrs ago   XI ROBOTIC ASSISTED SIMPLE PROSTATECTOMY  2009    Current Medications: Current Meds  Medication Sig   aspirin 81 MG tablet Take 81 mg by mouth every other day.   hydroxychloroquine (PLAQUENIL) 200 MG tablet Take 200 mg by mouth daily.   ibuprofen (ADVIL) 200 MG tablet Take 200-400 mg by mouth every 6 (six) hours as needed for fever, headache or mild pain (pain score  1-3).   levothyroxine (SYNTHROID, LEVOTHROID) 125 MCG tablet Take 125 mcg by mouth daily.   Multiple Vitamins-Minerals (MULTIVITAMIN PO) Take 1 tablet by mouth daily.    [DISCONTINUED] metoprolol tartrate (LOPRESSOR) 25 MG tablet Take 1 tablet (25 mg total) by mouth 2 (two) times daily.     Allergies:   Patient has no known allergies.   Social History   Socioeconomic History   Marital status: Married    Spouse name: Not on file   Number of children: Not on file   Years of education: Not on file   Highest education level: Not on file  Occupational History   Not on file  Tobacco Use   Smoking status: Never   Smokeless tobacco: Never  Vaping Use   Vaping status: Never Used  Substance and Sexual Activity   Alcohol use: Yes    Alcohol/week: 1.0 standard drink of alcohol    Types: 1 Glasses of wine per week    Comment: seldom   Drug use: Never   Sexual activity: Yes  Other Topics Concern   Not on file  Social History Narrative   ** Merged History Encounter **       Resides in [place]. Very physically active, enjoys cycling.   Social Drivers of Corporate investment banker Strain:  Not on file  Food Insecurity: No Food Insecurity (02/18/2024)   Hunger Vital Sign    Worried About Running Out of Food in the Last Year: Never true    Ran Out of Food in the Last Year: Never true  Transportation Needs: No Transportation Needs (02/18/2024)   PRAPARE - Administrator, Civil Service (Medical): No    Lack of Transportation (Non-Medical): No  Physical Activity: Not on file  Stress: Not on file  Social Connections: Unknown (02/18/2024)   Social Connection and Isolation Panel [NHANES]    Frequency of Communication with Friends and Family: Never    Frequency of Social Gatherings with Friends and Family: Never    Attends Religious Services: Never    Database administrator or Organizations: No    Attends Banker Meetings: Never    Marital Status: Not on file      Family History: The patient's family history includes Prostate cancer in his father. There is no history of Breast cancer, Colon cancer, or Pancreatic cancer.  ROS:   Please see the history of present illness.     All other systems reviewed and are negative.  EKGs/Labs/Other Studies Reviewed:    The following studies were reviewed today:  EKG Interpretation Date/Time:  Friday March 11 2024 14:56:38 EDT Ventricular Rate:  58 PR Interval:  164 QRS Duration:  96 QT Interval:  398 QTC Calculation: 390 R Axis:   87  Text Interpretation: Sinus bradycardia with sinus arrhythmia with occasional Premature ventricular complexes When compared with ECG of February 19, 2024 Premature ventricular complexes are now Present Afib has been replaced by NSR Confirmed by [place], Tajay Muzzy [phone]) on 03/11/2024 2:59:45 PMEKG Interpretation Date/Time:  Friday March 11 2024 14:56:38 EDT Ventricular Rate:  58 PR Interval:  164 QRS Duration:  96 QT Interval:  398 QTC Calculation: 390 R Axis:   87  Text Interpretation: Sinus bradycardia with sinus arrhythmia with occasional Premature ventricular complexes When compared with ECG of February 19, 2024 Premature ventricular complexes are now Present Afib has been replaced by NSR Confirmed by [place], Malik Beck [phone]) on 03/11/2024 2:59:45 PM    Recent Labs: 02/18/2024: ALT 19; BUN 16; Creatinine, Ser 0.86; Hemoglobin 11.5; Platelets 213; Potassium 3.6; Sodium 125  Recent Lipid Panel No results found for: "CHOL", "TRIG", "HDL", "CHOLHDL", "VLDL", "LDLCALC", "LDLDIRECT"   Risk Assessment/Calculations:    CHA2DS2-VASc Score = 3   This indicates a 3.2% annual risk of stroke. The patient's score is based upon: CHF History: 0 HTN History: 1 Diabetes History: 0 Stroke History: 0 Vascular Disease History: 0 Age Score: 2 Gender Score: 0          Physical Exam:    VS:  BP (!) 150/90   Pulse (!) 58   Ht 5\' 10"  (1.778 m)   Wt 132 lb 3.2 oz (60 kg)   SpO2 97%    BMI 18.97 kg/m     Wt Readings from Last 3 Encounters:  03/11/24 132 lb 3.2 oz (60 kg)  02/17/24 135 lb (61.2 kg)  12/28/18 147 lb 3.2 oz (66.8 kg)     GEN:  Well nourished, well developed in no acute distress HEENT: Normal NECK: No JVD; No carotid bruits LYMPHATICS: No lymphadenopathy CARDIAC: RRR, no murmurs, rubs, gallops RESPIRATORY:  Clear to auscultation without rales, wheezing or rhonchi  ABDOMEN: Soft, non-tender, non-distended MUSCULOSKELETAL:  No edema; No deformity  SKIN: Warm and dry NEUROLOGIC:  Alert and oriented x 3 PSYCHIATRIC:  Normal affect   ASSESSMENT:    1. Paroxysmal atrial fibrillation (HCC)   2. Essential hypertension    PLAN:    In order of problems listed above:  Paroxysmal Afib in setting of respiratory failure/sepsis and flu. Now back in NSR. Suspect this was all related to severe stress. Will obtain an Echo for completeness sake but unless he has recurrence I would not recommend anticoagulation.  HTN. BP high. Would favor resuming amlodipine and valsartan since this has worked well from him before. Can stop metoprolol.           Medication Adjustments/Labs and Tests Ordered: Current medicines are reviewed at length with the patient today.  Concerns regarding medicines are outlined above.  Orders Placed This Encounter  Procedures   EKG 12-Lead   Meds ordered this encounter  Medications   valsartan (DIOVAN) 160 MG tablet    Sig: Take 1 tablet (160 mg total) by mouth daily.    Dispense:  90 tablet    Refill:  3   amLODipine (NORVASC) 5 MG tablet    Sig: Take 1 tablet (5 mg total) by mouth daily.    Dispense:  90 tablet    Refill:  3    Patient Instructions  Medication Instructions:  Stop Metoprolol  *If you need a refill on your cardiac medications before your next appointment, please call your pharmacy*  Lab Work: None ordered  Testing/Procedures: Echo  first available   Follow-Up: At [facility], you and your  health needs are our priority.  As part of our continuing mission to provide you with exceptional heart care, our providers are all part of one team.  This team includes your primary Cardiologist (physician) and Advanced Practice Providers or APPs (Physician Assistants and Nurse Practitioners) who all work together to provide you with the care you need, when you need it.  Your next appointment:  To Be Determined    Provider:  Dr.Kwabena Strutz   We recommend signing up for the patient portal called "MyChart".  Sign up information is provided on this After Visit Summary.  MyChart is used to connect with patients for Virtual Visits (Telemedicine).  Patients are able to view lab/test results, encounter notes, upcoming appointments, etc.  Non-urgent messages can be sent to your provider as well.   To learn more about what you can do with MyChart, go to [email or web address].         1st Floor: - Lobby - Registration  - Pharmacy  - Lab - Cafe  2nd Floor: - PV Lab - Diagnostic Testing (echo, CT, nuclear med)  3rd Floor: - Vacant  4th Floor: - TCTS (cardiothoracic surgery) - AFib Clinic - Structural Heart Clinic - Vascular Surgery  - Vascular Ultrasound  5th Floor: - HeartCare Cardiology (general and EP) - Clinical Pharmacy for coumadin, hypertension, lipid, weight-loss medications, and med management appointments    Valet parking services will be available as well.      Signed, Terris Germano [place], MD  03/11/2024 3:19 PM    [facility]

## 2024-03-14 DIAGNOSIS — L812 Freckles: Secondary | ICD-10-CM | POA: Diagnosis not present

## 2024-03-14 DIAGNOSIS — D1801 Hemangioma of skin and subcutaneous tissue: Secondary | ICD-10-CM | POA: Diagnosis not present

## 2024-03-14 DIAGNOSIS — L821 Other seborrheic keratosis: Secondary | ICD-10-CM | POA: Diagnosis not present

## 2024-03-14 DIAGNOSIS — D2262 Melanocytic nevi of left upper limb, including shoulder: Secondary | ICD-10-CM | POA: Diagnosis not present

## 2024-03-14 DIAGNOSIS — D2271 Melanocytic nevi of right lower limb, including hip: Secondary | ICD-10-CM | POA: Diagnosis not present

## 2024-03-14 DIAGNOSIS — Z85820 Personal history of malignant melanoma of skin: Secondary | ICD-10-CM | POA: Diagnosis not present

## 2024-03-14 DIAGNOSIS — D225 Melanocytic nevi of trunk: Secondary | ICD-10-CM | POA: Diagnosis not present

## 2024-03-14 DIAGNOSIS — Z85828 Personal history of other malignant neoplasm of skin: Secondary | ICD-10-CM | POA: Diagnosis not present

## 2024-03-14 DIAGNOSIS — D2261 Melanocytic nevi of right upper limb, including shoulder: Secondary | ICD-10-CM | POA: Diagnosis not present

## 2024-03-16 ENCOUNTER — Telehealth: Payer: Self-pay | Admitting: Cardiology

## 2024-03-16 MED ORDER — METOPROLOL TARTRATE 25 MG PO TABS: 25.0000 mg | ORAL_TABLET | Freq: Two times a day (BID) | ORAL | 3 refills | Status: DC

## 2024-03-16 NOTE — Telephone Encounter (Signed)
 Spoke to patient Dr.Jordan advised to resume Metoprolol.He will start Metoprolol Tartrate 25 mg twice a day.Advised to monitor B/P daily and call back in 2 weeks to report readings.

## 2024-03-16 NOTE — Telephone Encounter (Signed)
 Pt c/o medication issue:  1. Name of Medication:    2. How are you currently taking this medication (dosage and times per day)?    3. Are you having a reaction (difficulty breathing--STAT)? no  4. What is your medication issue? Calling to speak with the dr about medication changes. Please advise

## 2024-03-16 NOTE — Telephone Encounter (Signed)
 Spoke to patient. Verified name and DOB.  Patient was prescribed Metoprolol tartrate 25 mg daily during hospital stay on 3/5-3/7.  Patient had OV with [name] on Friday 03/11/2024 and Metoprolol tartrate was discontinued.  Patient stated his HR and blood pressures have been elevated in the past 2 days. Patient was unable to provide exact HR readings. Patient provided the following BP readings: 160/88 - 03/15/2024 165/88 - 03/14/2024  Patient denies symptoms of HA, chest pain, and dizziness Patients stated feeling short of breath during physical activity. Metoprolol tartrate was last taken on Friday 03/11/2024.  Patient is concerned about going into afib and BP being elevated. Please advise.  Josie LPN

## 2024-03-18 DIAGNOSIS — R748 Abnormal levels of other serum enzymes: Secondary | ICD-10-CM | POA: Diagnosis not present

## 2024-03-18 DIAGNOSIS — R946 Abnormal results of thyroid function studies: Secondary | ICD-10-CM | POA: Diagnosis not present

## 2024-03-18 DIAGNOSIS — D473 Essential (hemorrhagic) thrombocythemia: Secondary | ICD-10-CM | POA: Diagnosis not present

## 2024-03-22 DIAGNOSIS — N179 Acute kidney failure, unspecified: Secondary | ICD-10-CM | POA: Diagnosis not present

## 2024-03-22 DIAGNOSIS — M069 Rheumatoid arthritis, unspecified: Secondary | ICD-10-CM | POA: Diagnosis not present

## 2024-03-22 DIAGNOSIS — Z8572 Personal history of non-Hodgkin lymphomas: Secondary | ICD-10-CM | POA: Diagnosis not present

## 2024-03-22 DIAGNOSIS — D72829 Elevated white blood cell count, unspecified: Secondary | ICD-10-CM | POA: Diagnosis not present

## 2024-03-22 DIAGNOSIS — C61 Malignant neoplasm of prostate: Secondary | ICD-10-CM | POA: Diagnosis not present

## 2024-03-22 DIAGNOSIS — I499 Cardiac arrhythmia, unspecified: Secondary | ICD-10-CM | POA: Diagnosis not present

## 2024-03-22 DIAGNOSIS — E039 Hypothyroidism, unspecified: Secondary | ICD-10-CM | POA: Diagnosis not present

## 2024-03-22 DIAGNOSIS — I10 Essential (primary) hypertension: Secondary | ICD-10-CM | POA: Diagnosis not present

## 2024-03-22 DIAGNOSIS — M791 Myalgia, unspecified site: Secondary | ICD-10-CM | POA: Diagnosis not present

## 2024-03-23 ENCOUNTER — Encounter (HOSPITAL_COMMUNITY): Payer: Self-pay

## 2024-03-23 ENCOUNTER — Inpatient Hospital Stay (HOSPITAL_COMMUNITY)
Admission: EM | Admit: 2024-03-23 | Discharge: 2024-03-25 | DRG: 640 | Disposition: A | Discharge status: Home / Self Care | Attending: Internal Medicine | Admitting: Internal Medicine

## 2024-03-23 ENCOUNTER — Other Ambulatory Visit: Payer: Self-pay

## 2024-03-23 DIAGNOSIS — Z85828 Personal history of other malignant neoplasm of skin: Secondary | ICD-10-CM

## 2024-03-23 DIAGNOSIS — I10 Essential (primary) hypertension: Secondary | ICD-10-CM | POA: Diagnosis present

## 2024-03-23 DIAGNOSIS — N170 Acute kidney failure with tubular necrosis: Secondary | ICD-10-CM | POA: Diagnosis present

## 2024-03-23 DIAGNOSIS — Z79899 Other long term (current) drug therapy: Secondary | ICD-10-CM

## 2024-03-23 DIAGNOSIS — N179 Acute kidney failure, unspecified: Secondary | ICD-10-CM | POA: Diagnosis not present

## 2024-03-23 DIAGNOSIS — E039 Hypothyroidism, unspecified: Secondary | ICD-10-CM | POA: Diagnosis present

## 2024-03-23 DIAGNOSIS — E876 Hypokalemia: Secondary | ICD-10-CM | POA: Diagnosis present

## 2024-03-23 DIAGNOSIS — Z8546 Personal history of malignant neoplasm of prostate: Secondary | ICD-10-CM

## 2024-03-23 DIAGNOSIS — E860 Dehydration: Secondary | ICD-10-CM | POA: Diagnosis present

## 2024-03-23 DIAGNOSIS — Z7982 Long term (current) use of aspirin: Secondary | ICD-10-CM

## 2024-03-23 DIAGNOSIS — M069 Rheumatoid arthritis, unspecified: Secondary | ICD-10-CM | POA: Diagnosis present

## 2024-03-23 DIAGNOSIS — I480 Paroxysmal atrial fibrillation: Secondary | ICD-10-CM | POA: Diagnosis present

## 2024-03-23 DIAGNOSIS — Z8572 Personal history of non-Hodgkin lymphomas: Secondary | ICD-10-CM

## 2024-03-23 DIAGNOSIS — Z79890 Hormone replacement therapy (postmenopausal): Secondary | ICD-10-CM

## 2024-03-23 DIAGNOSIS — Z8042 Family history of malignant neoplasm of prostate: Secondary | ICD-10-CM

## 2024-03-23 LAB — URINALYSIS, ROUTINE W REFLEX MICROSCOPIC
Bilirubin Urine: NEGATIVE
Glucose, UA: NEGATIVE mg/dL
Hgb urine dipstick: NEGATIVE
Ketones, ur: NEGATIVE mg/dL
Leukocytes,Ua: NEGATIVE
Nitrite: NEGATIVE
Protein, ur: NEGATIVE mg/dL
Specific Gravity, Urine: 1.003 — ABNORMAL LOW (ref 1.005–1.030)
pH: 7 (ref 5.0–8.0)

## 2024-03-23 LAB — BASIC METABOLIC PANEL WITH GFR
Anion gap: 8 (ref 5–15)
BUN: 23 mg/dL (ref 8–23)
CO2: 28 mmol/L (ref 22–32)
Calcium: 13.2 mg/dL (ref 8.9–10.3)
Chloride: 101 mmol/L (ref 98–111)
Creatinine, Ser: 1.58 mg/dL — ABNORMAL HIGH (ref 0.61–1.24)
GFR, Estimated: 44 mL/min — ABNORMAL LOW (ref >60)
Glucose, Bld: 92 mg/dL (ref 70–99)
Potassium: 3.2 mmol/L — ABNORMAL LOW (ref 3.5–5.1)
Sodium: 137 mmol/L (ref 135–145)

## 2024-03-23 LAB — CBC
HCT: 42.5 % (ref 39.0–52.0)
Hemoglobin: 13.8 g/dL (ref 13.0–17.0)
MCH: 29.9 pg (ref 26.0–34.0)
MCHC: 32.5 g/dL (ref 30.0–36.0)
MCV: 92 fL (ref 80.0–100.0)
Platelets: 318 10*3/uL (ref 150–400)
RBC: 4.62 MIL/uL (ref 4.22–5.81)
RDW: 13.3 % (ref 11.5–15.5)
WBC: 12.5 10*3/uL — ABNORMAL HIGH (ref 4.0–10.5)
nRBC: 0 % (ref 0.0–0.2)

## 2024-03-23 LAB — VITAMIN D 25 HYDROXY (VIT D DEFICIENCY, FRACTURES): Vit D, 25-Hydroxy: 85.53 ng/mL (ref 30–100)

## 2024-03-23 LAB — HEPATIC FUNCTION PANEL
ALT: 23 U/L (ref 0–44)
AST: 50 U/L — ABNORMAL HIGH (ref 15–41)
Albumin: 3.5 g/dL (ref 3.5–5.0)
Alkaline Phosphatase: 60 U/L (ref 38–126)
Bilirubin, Direct: 0.1 mg/dL (ref 0.0–0.2)
Indirect Bilirubin: 0.5 mg/dL (ref 0.3–0.9)
Total Bilirubin: 0.6 mg/dL (ref 0.0–1.2)
Total Protein: 7.4 g/dL (ref 6.5–8.1)

## 2024-03-23 LAB — PHOSPHORUS: Phosphorus: 4 mg/dL (ref 2.5–4.6)

## 2024-03-23 LAB — CBG MONITORING, ED: Glucose-Capillary: 73 mg/dL (ref 70–99)

## 2024-03-23 LAB — T4, FREE: Free T4: 1.41 ng/dL — ABNORMAL HIGH (ref 0.61–1.12)

## 2024-03-23 LAB — TSH: TSH: 7.384 u[IU]/mL — ABNORMAL HIGH (ref 0.350–4.500)

## 2024-03-23 LAB — MAGNESIUM: Magnesium: 1.8 mg/dL (ref 1.7–2.4)

## 2024-03-23 MED ORDER — POTASSIUM CHLORIDE CRYS ER 20 MEQ PO TBCR
40.0000 meq | EXTENDED_RELEASE_TABLET | Freq: Once | ORAL | Status: AC
  Administered 2024-03-23: 40 meq via ORAL
  Filled 2024-03-23: qty 2

## 2024-03-23 MED ORDER — LEVOTHYROXINE SODIUM 25 MCG PO TABS
137.0000 ug | ORAL_TABLET | Freq: Every day | ORAL | Status: DC
  Administered 2024-03-24 – 2024-03-25 (×2): 137 ug via ORAL
  Filled 2024-03-23 (×2): qty 1

## 2024-03-23 MED ORDER — ONDANSETRON HCL 4 MG PO TABS: 4.0000 mg | ORAL_TABLET | Freq: Four times a day (QID) | ORAL | Status: DC | PRN

## 2024-03-23 MED ORDER — SODIUM CHLORIDE 0.9 % IV BOLUS
1000.0000 mL | Freq: Once | INTRAVENOUS | Status: AC
  Administered 2024-03-23: 1000 mL via INTRAVENOUS

## 2024-03-23 MED ORDER — HYDROXYCHLOROQUINE SULFATE 200 MG PO TABS
200.0000 mg | ORAL_TABLET | Freq: Every day | ORAL | Status: DC
  Administered 2024-03-23 – 2024-03-25 (×3): 200 mg via ORAL
  Filled 2024-03-23 (×4): qty 1

## 2024-03-23 MED ORDER — ONDANSETRON HCL 4 MG/2ML IJ SOLN: 4.0000 mg | Freq: Four times a day (QID) | INTRAMUSCULAR | Status: DC | PRN

## 2024-03-23 MED ORDER — ASPIRIN 81 MG PO TBEC
81.0000 mg | DELAYED_RELEASE_TABLET | Freq: Every day | ORAL | Status: DC
  Administered 2024-03-23 – 2024-03-25 (×3): 81 mg via ORAL
  Filled 2024-03-23 (×3): qty 1

## 2024-03-23 MED ORDER — ALBUTEROL SULFATE (2.5 MG/3ML) 0.083% IN NEBU: 2.5000 mg | INHALATION_SOLUTION | RESPIRATORY_TRACT | Status: DC | PRN

## 2024-03-23 MED ORDER — ACETAMINOPHEN 325 MG PO TABS: 650.0000 mg | ORAL_TABLET | Freq: Four times a day (QID) | ORAL | Status: DC | PRN

## 2024-03-23 MED ORDER — TRAZODONE HCL 50 MG PO TABS: 25.0000 mg | ORAL_TABLET | Freq: Every evening | ORAL | Status: DC | PRN

## 2024-03-23 MED ORDER — SODIUM CHLORIDE 0.9 % IV SOLN: INTRAVENOUS | Status: AC

## 2024-03-23 MED ORDER — AMLODIPINE BESYLATE 5 MG PO TABS
5.0000 mg | ORAL_TABLET | Freq: Two times a day (BID) | ORAL | Status: DC
  Administered 2024-03-23 – 2024-03-25 (×4): 5 mg via ORAL
  Filled 2024-03-23 (×4): qty 1

## 2024-03-23 MED ORDER — HEPARIN SODIUM (PORCINE) 5000 UNIT/ML IJ SOLN
5000.0000 [IU] | Freq: Three times a day (TID) | INTRAMUSCULAR | Status: DC
  Administered 2024-03-23 – 2024-03-25 (×6): 5000 [IU] via SUBCUTANEOUS
  Filled 2024-03-23 (×5): qty 1

## 2024-03-23 MED ORDER — ACETAMINOPHEN 650 MG RE SUPP: 650.0000 mg | Freq: Four times a day (QID) | RECTAL | Status: DC | PRN

## 2024-03-23 NOTE — Plan of Care (Signed)

## 2024-03-23 NOTE — H&P (Signed)
 History and Physical  Malik Beck:[id number] DOB: 03-Aug-1946 DOA: 03/23/2024  PCP: Emilio Aspen, MD   Chief Complaint: Hypercalcemia  HPI: Malik Beck is a very active 78 y.o. male with medical history significant for rheumatoid arthritis, hypertension, hypothyroidism, remote prostate cancer, paroxysmal atrial fibrillation being admitted to the hospital with hypercalcemia.  Tells me that he is generally very healthy and active, he was hospitalized with the flu last month, and since that time he has been having some lab and vital abnormalities, and just feeling generally weak.  He denies any specific pain, weight loss, nausea, vomiting, fevers or other concerns.  He had a routine outpatient follow-up with his PCP Dr. [name] yesterday, at which time lab work revealed persistent hypercalcemia and AKI he was therefore told to come to the ER for evaluation.  He specifically denies any extremity or perioral numbness, tingling, spasms, cramping, confusion, somnolence etc.  Review of Systems: Please see HPI for pertinent positives and negatives. A complete 10 system review of systems are otherwise negative.  Past Medical History:  Diagnosis Date   Arthritis    1 finger   Asthma    childhood asthma none now   Cancer (HCC)    nhl, prostate ca   Hypothyroidism    Prostate cancer [name]) 2009   Skin cancer April 2012   Past Surgical History:  Procedure Laterality Date   big toe surgery  yrs ago   COLONOSCOPY WITH PROPOFOL N/A 05/04/2017   Procedure: COLONOSCOPY WITH PROPOFOL;  Surgeon: Charolett Bumpers, MD;  Location: [facility];  Service: Endoscopy;  Laterality: N/A;   colonscopy  yrs ago   NASAL SEPTUM SURGERY  yrs ago   XI ROBOTIC ASSISTED SIMPLE PROSTATECTOMY  2009   Social History:  reports that he has never smoked. He has never used smokeless tobacco. He reports current alcohol use of about 1.0 standard drink of alcohol per week. He reports that he does not use  drugs.  No Known Allergies  Family History  Problem Relation Age of Onset   Prostate cancer Father    Breast cancer Neg Hx    Colon cancer Neg Hx    Pancreatic cancer Neg Hx      Prior to Admission medications   Medication Sig Start Date End Date Taking? Authorizing Provider  amLODipine (NORVASC) 5 MG tablet Take 1 tablet (5 mg total) by mouth daily. Patient taking differently: Take 5 mg by mouth in the morning and at bedtime. 03/11/24  Yes [place], Peter M, MD  aspirin 81 MG tablet Take 81 mg by mouth daily.   Yes [provider]  hydroxychloroquine (PLAQUENIL) 200 MG tablet Take 200 mg by mouth daily. 12/07/23  Yes [provider]  ibuprofen (ADVIL) 200 MG tablet Take 400 mg by mouth daily as needed for fever, headache or mild pain (pain score 1-3).   Yes [provider]  levothyroxine (SYNTHROID) 137 MCG tablet Take 137 mcg by mouth daily before breakfast.   Yes [provider]  Multiple Vitamins-Minerals (MULTIVITAMIN PO) Take 1 tablet by mouth daily.    Yes [provider]  levothyroxine (SYNTHROID, LEVOTHROID) 125 MCG tablet Take 125 mcg by mouth daily. Patient not taking: Reported on 03/23/2024    [provider]  metoprolol tartrate (LOPRESSOR) 25 MG tablet Take 1 tablet (25 mg total) by mouth 2 (two) times daily. Patient not taking: Reported on 03/23/2024 03/16/24 06/14/24  [place], Peter M, MD  valsartan (DIOVAN) 160 MG tablet Take  1 tablet (160 mg total) by mouth daily. Patient not taking: Reported on 03/23/2024 03/11/24   [place], Peter M, MD    Physical Exam: BP (!) 180/88   Pulse 85   Temp 98 F (36.7 C) (Oral)   Resp 20   Ht 5\' 10"  (1.778 m)   Wt 61.2 kg   SpO2 96%   BMI 19.37 kg/m  General:  Alert, oriented, calm, in no acute distress, pleasant and cooperative Eyes: EOMI, clear conjuctivae, white sclerea Neck: supple, no masses, trachea mildline  Cardiovascular: RRR, no murmurs or rubs, no peripheral edema   Respiratory: clear to auscultation bilaterally, no wheezes, no crackles  Abdomen: soft, nontender, nondistended, normal bowel tones heard  Skin: dry, no rashes  Musculoskeletal: no joint effusions, normal range of motion  Psychiatric: appropriate affect, normal speech  Neurologic: extraocular muscles intact, clear speech, moving all extremities with intact sensorium         Labs on Admission:  Basic Metabolic Panel: Recent Labs  Lab 03/23/24 1109  NA 137  K 3.2*  CL 101  CO2 28  GLUCOSE 92  BUN 23  CREATININE 1.58*  CALCIUM 13.2*  PHOS 4.0   Liver Function Tests: Recent Labs  Lab 03/23/24 1109  AST 50*  ALT 23  ALKPHOS 60  BILITOT 0.6  PROT 7.4  ALBUMIN 3.5   No results for input(s): "LIPASE", "AMYLASE" in the last 168 hours. No results for input(s): "AMMONIA" in the last 168 hours. CBC: Recent Labs  Lab 03/23/24 1109  WBC 12.5*  HGB 13.8  HCT 42.5  MCV 92.0  PLT 318   Cardiac Enzymes: No results for input(s): "CKTOTAL", "CKMB", "CKMBINDEX", "TROPONINI" in the last 168 hours. BNP (last 3 results) No results for input(s): "BNP" in the last 8760 hours.  ProBNP (last 3 results) No results for input(s): "PROBNP" in the last 8760 hours.  CBG: Recent Labs  Lab 03/23/24 1237  GLUCAP 73    Radiological Exams on Admission: No results found. Assessment/Plan Malik Beck is a very active 78 y.o. male with medical history significant for rheumatoid arthritis, hypertension, hypothyroidism, remote prostate cancer and myeloma, recent paroxysmal atrial fibrillation being admitted to the hospital with hypercalcemia.   Hypercalcemia-unclear etiology, patient is mildly dehydrated.  Not clearly symptomatic, though this may explain his recent lethargy which is quite abnormal for him.  He does have a history of prostate cancer status post prostatectomy and follows closely with his urologist, with no recent evidence of recurrence. -Observation admission -Continue  normal saline hydration -Trend calcium with morning labs -Check ionized calcium, PTH, vitamin D level, TSH, T4  Hypertension-patient takes amlodipine 5 mg p.o. twice daily, will continue this  Paroxysmal atrial fibrillation-was identified in the setting of his recent hospitalization while having the flu.  Has seen cardiology as an outpatient, with plan for routine echo.  Not on anticoagulation.  Hypokalemia-repleted orally  Rheumatoid arthritis-on long-term Plaquenil, will continue this  DVT prophylaxis: Subcutaneous heparin    Code Status: Full Code  Consults called: None  Admission status: Observation  Time spent: 48 minutes  Avett Reineck Sharlette Dense MD [facility] Pager [phone]  If 7PM-7AM, please contact night-coverage [email or web address] Password [email or web address]  03/23/2024, 2:06 PM

## 2024-03-23 NOTE — ED Provider Notes (Signed)
 [place] EMERGENCY DEPARTMENT AT [facility] Provider Note   CSN: [id number] Arrival date & time: 03/23/24  0857     History  Chief Complaint  Patient presents with   high calcium    Malik Beck is a 78 y.o. male.  Patient is a 78 year old male with a past medical history of hypertension, CHF, A-fib, RA and hypothyroidism presenting to the emergency department with hypercalcemia.  The patient was admitted to the hospital last month with pneumonia and influenza.  He states he had a follow-up appointment with his primary doctor yesterday and had labs performed.  He was called today to tell him that his calcium levels were high and his kidney function was elevated and he was recommended to come to the ER for further management and evaluation.  The patient states since his last hospitalization his thyroid numbers have been off and recently had his levothyroxine increased.  He otherwise denies any other medication changes.  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  Is not currently taking any calcium supplements.  He states that he has otherwise been feeling well and denies any associated nausea, vomiting abdominal pain, dysuria or hematuria.  He states that he has had some mild muscle aches in his thighs.  He denies any prior surgery on his thyroid.  The history is provided by the patient, the spouse and medical records.       Home Medications Prior to Admission medications   Medication Sig Start Date End Date Taking? Authorizing Provider  amLODipine (NORVASC) 5 MG tablet Take 1 tablet (5 mg total) by mouth daily. 03/11/24   [place], Peter M, MD  aspirin 81 MG tablet Take 81 mg by mouth every other day.    [provider]  hydroxychloroquine (PLAQUENIL) 200 MG tablet Take 200 mg by mouth daily. 12/07/23   [provider]  ibuprofen (ADVIL)  200 MG tablet Take 200-400 mg by mouth every 6 (six) hours as needed for fever, headache or mild pain (pain score 1-3).    [provider]  levothyroxine (SYNTHROID, LEVOTHROID) 125 MCG tablet Take 125 mcg by mouth daily.    [provider]  metoprolol tartrate (LOPRESSOR) 25 MG tablet Take 1 tablet (25 mg total) by mouth 2 (two) times daily. 03/16/24 06/14/24  [place], Peter M, MD  Multiple Vitamins-Minerals (MULTIVITAMIN PO) Take 1 tablet by mouth daily.     [provider]  valsartan (DIOVAN) 160 MG tablet Take 1 tablet (160 mg total) by mouth daily. 03/11/24   [place], Peter M, MD      Allergies    Patient has no known allergies.    Review of Systems   Review of Systems  Physical Exam Updated Vital Signs BP (!) 180/88   Pulse 85   Temp 98 F (36.7 C) (Oral)   Resp 20   Ht 5\' 10"  (1.778 m)   Wt 61.2 kg   SpO2 96%   BMI 19.37 kg/m  Physical Exam Vitals and nursing note reviewed.  Constitutional:      General: He is not in acute distress.    Appearance: Normal appearance.  HENT:     Head: Normocephalic and atraumatic.     Nose: Nose normal.     Mouth/Throat:     Mouth: Mucous membranes are moist.     Pharynx: Oropharynx is clear.  Eyes:     Extraocular Movements: Extraocular movements intact.     Conjunctiva/sclera: Conjunctivae normal.  Cardiovascular:     Rate and Rhythm: Normal rate and regular rhythm.     Heart sounds: Normal heart sounds.  Pulmonary:     Effort: Pulmonary effort is normal.     Breath sounds: Normal breath sounds.  Abdominal:     General: Abdomen is flat.     Palpations: Abdomen is soft.     Tenderness: There is no abdominal tenderness.  Musculoskeletal:        General: Normal range of motion.     Cervical back: Normal range of motion.  Skin:    General: Skin is warm and dry.  Neurological:     General: No focal deficit present.     Mental Status: He is alert and oriented to person, place, and time.  Psychiatric:         Mood and Affect: Mood normal.        Behavior: Behavior normal.     ED Results / Procedures / Treatments   Labs (all labs ordered are listed, but only abnormal results are displayed) Labs Reviewed  BASIC METABOLIC PANEL WITH GFR - Abnormal; Notable for the following components:      Result Value   Potassium 3.2 (*)    Creatinine, Ser 1.58 (*)    Calcium 13.2 (*)    GFR, Estimated 44 (*)    All other components within normal limits  CBC - Abnormal; Notable for the following components:   WBC 12.5 (*)    All other components within normal limits  URINALYSIS, ROUTINE W REFLEX MICROSCOPIC - Abnormal; Notable for the following components:   Color, Urine STRAW (*)    Specific Gravity, Urine 1.003 (*)    All other components within normal limits  HEPATIC FUNCTION PANEL - Abnormal; Notable for the following components:   AST 50 (*)    All other components within normal limits  PHOSPHORUS  CALCIUM, IONIZED  TSH  T4, FREE  PARATHYROID HORMONE, INTACT (NO CA)  CBG MONITORING, ED    EKG EKG Interpretation Date/Time:  Wednesday March 23 2024 10:40:55 EDT Ventricular Rate:  84 PR Interval:  172 QRS Duration:  101 QT Interval:  395 QTC Calculation: 467 R Axis:   50  Text Interpretation: Sinus rhythm Atrial premature complex RSR' in V1 or V2, probably normal variant Nonspecific T abnrm, anterolateral leads PVCs resolved compared to prior EKG Confirmed by Elayne Snare ([id number]) on 03/23/2024 10:55:27 AM  Radiology No results found.  Procedures Procedures    Medications Ordered in ED Medications  sodium chloride 0.9 % bolus 1,000 mL (1,000 mLs Intravenous New Bag/Given 03/23/24 1117)    ED Course/ Medical Decision Making/ A&P Clinical Course as of 03/23/24 1313  Wed Mar 23, 2024  1236 Calcium 13.2, Cr 1.58 from baseline 0.8. Will be recommended admission for AKI in the setting of hypercalcemia with further work up of etiology. [VK]    Clinical Course User Index [VK]  Rexford Maus, DO                                 Medical Decision Making This patient presents to the ED with chief complaint(s) of hypercalcemia with pertinent past medical history of HTN, CHF, A fib, hypothyroidism, RA which further complicates the presenting complaint. The complaint involves an extensive differential diagnosis and also carries with it  a high risk of complications and morbidity.    The differential diagnosis includes renal dysfunction, electrolyte derangement, arrhythmia, anemia, malignancy, parathyroid dysfunction, thyroid dysfunction  Additional history obtained: Additional history obtained from spouse Records reviewed previous admission documents and Care Everywhere/External Records  ED Course and Reassessment: On patient's arrival he is mildly hypertensive and otherwise hemodynamically stable in no acute distress.  EKG on arrival showed normal sinus rhythm with normal intervals and no ischemic changes.  Patient will have repeat labs drawn here including ionized calcium, PTH and TSH and he will be closely reassessed.  Independent labs interpretation:  The following labs were independently interpreted: hypercalcemia, AKI  Independent visualization of imaging: - n/a  Consultation: - Consulted or discussed management/test interpretation w/ external professional: hospitalist  Consideration for admission or further workup: patient requires admission for hypercalcemia and AKI Social Determinants of health: N/A    Amount and/or Complexity of Data Reviewed Labs: ordered.  Risk Decision regarding hospitalization.          Final Clinical Impression(s) / ED Diagnoses Final diagnoses:  Hypercalcemia  AKI (acute kidney injury) [facility])    Rx / DC Orders ED Discharge Orders     None         Rexford Maus, DO 03/23/24 1313

## 2024-03-23 NOTE — ED Triage Notes (Signed)
 Pt arrived reporting he was sent by MD for high calcium levels. States level was 14. Endorses feeling weak for a while now. Reports having the flu and being admitted a month ago and hasn't felt normal since. Denies any cp,dizziness or any other symptoms.

## 2024-03-24 ENCOUNTER — Inpatient Hospital Stay (HOSPITAL_COMMUNITY)

## 2024-03-24 DIAGNOSIS — Z8042 Family history of malignant neoplasm of prostate: Secondary | ICD-10-CM | POA: Diagnosis not present

## 2024-03-24 DIAGNOSIS — N170 Acute kidney failure with tubular necrosis: Secondary | ICD-10-CM | POA: Diagnosis not present

## 2024-03-24 DIAGNOSIS — E0590 Thyrotoxicosis, unspecified without thyrotoxic crisis or storm: Secondary | ICD-10-CM | POA: Diagnosis not present

## 2024-03-24 DIAGNOSIS — E860 Dehydration: Secondary | ICD-10-CM | POA: Diagnosis not present

## 2024-03-24 DIAGNOSIS — E876 Hypokalemia: Secondary | ICD-10-CM | POA: Diagnosis not present

## 2024-03-24 DIAGNOSIS — I480 Paroxysmal atrial fibrillation: Secondary | ICD-10-CM | POA: Diagnosis not present

## 2024-03-24 DIAGNOSIS — Z85828 Personal history of other malignant neoplasm of skin: Secondary | ICD-10-CM | POA: Diagnosis not present

## 2024-03-24 DIAGNOSIS — I10 Essential (primary) hypertension: Secondary | ICD-10-CM | POA: Diagnosis not present

## 2024-03-24 DIAGNOSIS — Z79899 Other long term (current) drug therapy: Secondary | ICD-10-CM | POA: Diagnosis not present

## 2024-03-24 DIAGNOSIS — E039 Hypothyroidism, unspecified: Secondary | ICD-10-CM | POA: Diagnosis not present

## 2024-03-24 DIAGNOSIS — Z8572 Personal history of non-Hodgkin lymphomas: Secondary | ICD-10-CM | POA: Diagnosis not present

## 2024-03-24 DIAGNOSIS — M069 Rheumatoid arthritis, unspecified: Secondary | ICD-10-CM | POA: Diagnosis not present

## 2024-03-24 DIAGNOSIS — Z7982 Long term (current) use of aspirin: Secondary | ICD-10-CM | POA: Diagnosis not present

## 2024-03-24 DIAGNOSIS — Z8546 Personal history of malignant neoplasm of prostate: Secondary | ICD-10-CM | POA: Diagnosis not present

## 2024-03-24 DIAGNOSIS — Z79890 Hormone replacement therapy (postmenopausal): Secondary | ICD-10-CM | POA: Diagnosis not present

## 2024-03-24 LAB — CBC
HCT: 38.8 % — ABNORMAL LOW (ref 39.0–52.0)
Hemoglobin: 12.5 g/dL — ABNORMAL LOW (ref 13.0–17.0)
MCH: 30 pg (ref 26.0–34.0)
MCHC: 32.2 g/dL (ref 30.0–36.0)
MCV: 93.3 fL (ref 80.0–100.0)
Platelets: 314 10*3/uL (ref 150–400)
RBC: 4.16 MIL/uL — ABNORMAL LOW (ref 4.22–5.81)
RDW: 13.3 % (ref 11.5–15.5)
WBC: 7.1 10*3/uL (ref 4.0–10.5)
nRBC: 0 % (ref 0.0–0.2)

## 2024-03-24 LAB — PARATHYROID HORMONE, INTACT (NO CA): PTH: 6 pg/mL — ABNORMAL LOW (ref 15–65)

## 2024-03-24 LAB — BASIC METABOLIC PANEL WITH GFR
Anion gap: 9 (ref 5–15)
BUN: 20 mg/dL (ref 8–23)
CO2: 25 mmol/L (ref 22–32)
Calcium: 11.3 mg/dL — ABNORMAL HIGH (ref 8.9–10.3)
Chloride: 105 mmol/L (ref 98–111)
Creatinine, Ser: 1.46 mg/dL — ABNORMAL HIGH (ref 0.61–1.24)
GFR, Estimated: 49 mL/min — ABNORMAL LOW (ref >60)
Glucose, Bld: 79 mg/dL (ref 70–99)
Potassium: 3.6 mmol/L (ref 3.5–5.1)
Sodium: 139 mmol/L (ref 135–145)

## 2024-03-24 LAB — CALCIUM, IONIZED: Calcium, Ionized, Serum: 7.1 mg/dL — ABNORMAL HIGH (ref 4.5–5.6)

## 2024-03-24 MED ORDER — POTASSIUM CHLORIDE IN NACL 20-0.9 MEQ/L-% IV SOLN
INTRAVENOUS | Status: DC
  Filled 2024-03-24 (×2): qty 1000

## 2024-03-24 MED ORDER — METOPROLOL TARTRATE 25 MG PO TABS
25.0000 mg | ORAL_TABLET | Freq: Two times a day (BID) | ORAL | Status: DC
  Administered 2024-03-24 – 2024-03-25 (×3): 25 mg via ORAL
  Filled 2024-03-24 (×3): qty 1

## 2024-03-24 NOTE — TOC Initial Note (Addendum)
 Transition of Care [facility]) - Initial/Assessment Note    Patient Details  Name: Malik Beck MRN: [id number] Date of Birth: 02-09-1946  Transition of Care [facility]) CM/SW Contact:    Jessie Foot, RN Phone Number: 03/24/2024, 12:41 PM  Clinical Narrative:                 MOON. NCM spoke with patient in room sitting in recliner with spouse Darl Pikes) at bedside. PTA patient states he lives in a single family home, independent, and drives to appointments. Denies any DME or active with HH and oxygen.Verified PCP and insurance. Patient will be transported home via private vehicle at discharge. TOC will follow progression to discharge.  Expected Discharge Plan: Home/Self Care Barriers to Discharge: Continued Medical Work up   Patient Goals and CMS Choice            Expected Discharge Plan and Services   Discharge Planning Services: CM Consult Post Acute Care Choice: NA Living arrangements for the past 2 months: Single Family Home                 DME Arranged: N/A DME Agency: NA       HH Arranged: NA HH Agency: NA        Prior Living Arrangements/Services Living arrangements for the past 2 months: Single Family Home Lives with:: Spouse Patient language and need for interpreter reviewed:: Yes Do you feel safe going back to the place where you live?: Yes      Need for Family Participation in Patient Care: No (Comment) Care giver support system in place?: Yes (comment)   Criminal Activity/Legal Involvement Pertinent to Current Situation/Hospitalization: No - Comment as needed  Activities of Daily Living   ADL Screening (condition at time of admission) Independently performs ADLs?: Yes (appropriate for developmental age) Is the patient deaf or have difficulty hearing?: No Does the patient have difficulty seeing, even when wearing glasses/contacts?: No Does the patient have difficulty concentrating, remembering, or making decisions?: No  Permission  Sought/Granted Permission sought to share information with : Case Manager Permission granted to share information with : Yes, Verbal Permission Granted  Share Information with NAME: Wyatt Thorstenson     Permission granted to share info w Relationship: Spouse  Permission granted to share info w Contact Information: [phone]  Emotional Assessment Appearance:: Appears stated age Attitude/Demeanor/Rapport: Engaged Affect (typically observed): Appropriate, Accepting Orientation: : Oriented to Self, Oriented to Place, Oriented to  Time, Oriented to Situation Alcohol / Substance Use: Not Applicable Psych Involvement: No (comment)  Admission diagnosis:  Hypercalcemia [E83.52] AKI (acute kidney injury) (HCC) [N17.9] Patient Active Problem List   Diagnosis Date Noted   Hypercalcemia 03/23/2024   Hyponatremia 02/17/2024   Influenza A with respiratory manifestations 02/17/2024   AKI (acute kidney injury) (HCC) 02/17/2024   Leucocytosis 02/17/2024   Multifocal pneumonia 02/17/2024   Acute hypoxic respiratory failure (HCC) 02/17/2024   Hypothyroidism 02/17/2024   Osteoarthritis 02/17/2024   Essential hypertension 02/17/2024   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 AT [address]  [address] [place] [address] Phone: [phone] Fax: [phone]  [facility] [address] [place] [address] Phone: [phone] Fax:  [phone]     Social Drivers of Health (SDOH) Social History: SDOH Screenings   Food Insecurity: No Food Insecurity (03/23/2024)  Housing: Low Risk  (03/24/2024)  Recent Concern: Housing - High Risk (03/23/2024)  Transportation Needs: No Transportation Needs (03/23/2024)  Utilities: Not At Risk (03/23/2024)  Social Connections: Patient Declined (03/23/2024)  Tobacco Use: Low Risk  (03/23/2024)   SDOH Interventions: Housing Interventions: Intervention Not Indicated   Readmission Risk Interventions     No data to display

## 2024-03-24 NOTE — Progress Notes (Signed)
 PROGRESS NOTE    Malik Beck  WUJ:[id number] DOB: Apr 19, 1946 DOA: 03/23/2024 PCP: Emilio Aspen, MD    Brief Narrative:   MARKEVIUS Beck is a 78 y.o. male with past medical history significant for HTN, hypothyroidism, paroxysmal atrial fibrillation, rheumatoid arthritis, history of remote prostate cancer who presented to [facility] ED on 03/23/2024 by direction of his PCP for abnormal labs.  Patient reports has been feeling "weak" for several weeks.  Recently seen PCP with labs and increased his levothyroxine given his thyroid numbers were "off".  Labs notable for elevated calcium level and was sent to the ED for further evaluation and workup.  Patient denies any pain, no chest pain, no palpitations, no shortness of breath, no abdominal pain, no weight loss, no nausea/vomiting, no fevers, no urinary symptoms.  In the ED, temperature 97.6 F, HR 86, RR 18, BP 177/101, SpO2 98% on room air.  WBC 12.5, hemoglobin 13.8, platelet count 318.  Sodium 137, potassium 3.2, chloride 101, CO2 28, glucose 92, BUN 23, creatinine 1.58 (baseline 0.86 on 02/18/2024).  Calcium 13.2.  Ionized calcium is 7.1.  AST 50, ALT 23, total bilirubin 0.6.  Patient was given 1 L NS bolus.  EDP consulted [facility] for admission for further evaluation management of hypercalcemia, acute renal failure.  Assessment & Plan:   Acute renal failure Patient presenting with an elevated creatinine of 1.58, baseline creatinine 0.86 on February 18, 2024.  Suspect volume depletion.  Previously on ARB but has been subsequently discontinued by PCP. -- Cr 1.58>1.46 -- Continue IVF hydration w/ NS at 75 mL/h -- Repeat BMP in a.m.  Hypercalcemia Patient presenting with a calcium level of 13.2, ionized calcium 7.1.  Intact PTH low at 6.  Unclear etiology, differential diagnosis includes primary hyperparathyroidism, malignancy, vitamin D intoxication (vitamin d level normal), thiazide diuretic (patient reports now on this type of  medication), milk-alkali syndrome (denies excessive calcium intake), hyperthyroidism, sarcoidosis, versus dehydration. -- Ca 13.2>11.3 -- Low PTH, which would be more prevalent with low calcium -- TSH elevated 7.384, free T4 elevated 1.41; with even recent increase in levothyroxine outpatient -- Check angiotensin-converting enzyme -- Check PTHrP -- Check thyroid antibodies -- Thyroid ultrasound -- Will benefit from endocrinology referral outpatient, [name] -- BMP daily  Hypothyroidism Patient with recent increase of levothyroxine to 137 mcg p.o. daily by PCP for thyroid studies being "off".  TFTs elevated with free T47.384, free T41.41. -- Check free T3, thyroid antibodies, thyroid ultrasound as above -- Endocrinology outpatient referral, [name]  Hypokalemia Potassium 3.2, repleted.  Repeat potassium 3.6 this morning. -- BMP in a.m.  Essential hypertension Paroxysmal atrial fibrillation Follows with cardiology outpatient, [name]; last seen on 03/11/2024.  Patient had single occurrence with recent hospitalization for pneumonia/influenza, thought to be secondary to stress.  Cardiology has recommended against anticoagulation at this time. -- Metoprolol tartrate 25 mg p.o. twice daily -- Amlodipine 5 mg p.o. twice daily -- Continue monitor and adjust as needed  Rheumatoid arthritis -- Hydroxychloroquine 20 mg p.o. daily -- Outpatient follow with rheumatology  History of remote prostate cancer Follows with urology outpatient, Dr. [name].  PET scan 2022 with no active disease process.  Continue outpatient follow-up.   DVT prophylaxis: heparin injection 5,000 Units Start: 03/23/24 1430    Code Status: Full Code Family Communication: Updated spouse present at bedside this morning  Disposition Plan:  Level of care: Med-Surg Status is: Inpatient Remains inpatient appropriate because: IV fluid hydration, repeat labs in  the a.m.    Consultants:  None  Procedures:  Thyroid  ultrasound: Pending  Antimicrobials:  None   Subjective: Patient seen examined bedside, lying in bed.  Spouse present.  Concerned about his blood pressure.  He reports previously on amlodipine and valsartan for many years in which his blood pressure remained well-controlled.  Now was changed to metoprolol for A-fib; recently saw cardiology outpatient, [name].  Currently on IV fluids.  Creatinine improved as well as calcium level.  Discussed will continue IV fluid hydration and obtain further lab testing for workup of elevated calcium including thyroid ultrasound.  Patient with no other specific complaints, concerns or questions at this time.  Denies headache, no visual changes, no chest pain, no palpitations, no shortness of breath, no abdominal pain, no fever/chills/night sweats, no nausea/vomiting/diarrhea, no focal weakness, no fatigue, no paresthesias.  No acute events overnight per nursing staff.  Objective: Vitals:   03/23/24 2033 03/23/24 2248 03/24/24 0309 03/24/24 1321  BP: (!) 170/91 (!) 167/96 (!) 167/84 (!) 144/90  Pulse: 73 82 71 78  Resp:   18 18  Temp: 98.9 F (37.2 C)  97.9 F (36.6 C) 97.7 F (36.5 C)  TempSrc: Oral     SpO2: 97%  98% 98%  Weight:      Height:        Intake/Output Summary (Last 24 hours) at 03/24/2024 1403 Last data filed at 03/24/2024 0955 Gross per 24 hour  Intake 0 ml  Output 2450 ml  Net -2450 ml   Filed Weights   03/23/24 0918  Weight: 61.2 kg    Examination:  Physical Exam: GEN: NAD, alert and oriented x 3, wd/wn HEENT: NCAT, PERRL, EOMI, sclera clear, MMM PULM: CTAB w/o wheezes/crackles, normal respiratory effort, on room air CV: RRR w/o M/G/R GI: abd soft, NTND, NABS, no R/G/M MSK: no peripheral edema, muscle strength globally intact 5/5 bilateral upper/lower extremities NEURO: CN II-XII intact, no focal deficits, sensation to light touch intact PSYCH: normal mood/affect Integumentary: dry/intact, no rashes or  wounds    Data Reviewed: I have personally reviewed following labs and imaging studies  CBC: Recent Labs  Lab 03/23/24 1109 03/24/24 0400  WBC 12.5* 7.1  HGB 13.8 12.5*  HCT 42.5 38.8*  MCV 92.0 93.3  PLT 318 314   Basic Metabolic Panel: Recent Labs  Lab 03/23/24 1109 03/23/24 1330 03/24/24 0400  NA 137  --  139  K 3.2*  --  3.6  CL 101  --  105  CO2 28  --  25  GLUCOSE 92  --  79  BUN 23  --  20  CREATININE 1.58*  --  1.46*  CALCIUM 13.2*  --  11.3*  MG  --  1.8  --   PHOS 4.0  --   --    GFR: Estimated Creatinine Clearance: 36.1 mL/min (A) (by C-G formula based on SCr of 1.46 mg/dL (H)). Liver Function Tests: Recent Labs  Lab 03/23/24 1109  AST 50*  ALT 23  ALKPHOS 60  BILITOT 0.6  PROT 7.4  ALBUMIN 3.5   No results for input(s): "LIPASE", "AMYLASE" in the last 168 hours. No results for input(s): "AMMONIA" in the last 168 hours. Coagulation Profile: No results for input(s): "INR", "PROTIME" in the last 168 hours. Cardiac Enzymes: No results for input(s): "CKTOTAL", "CKMB", "CKMBINDEX", "TROPONINI" in the last 168 hours. BNP (last 3 results) No results for input(s): "PROBNP" in the last 8760 hours. HbA1C: No results for input(s): "HGBA1C"  in the last 72 hours. CBG: Recent Labs  Lab 03/23/24 1237  GLUCAP 73   Lipid Profile: No results for input(s): "CHOL", "HDL", "LDLCALC", "TRIG", "CHOLHDL", "LDLDIRECT" in the last 72 hours. Thyroid Function Tests: Recent Labs    03/23/24 1330  TSH 7.384*  FREET4 1.41*   Anemia Panel: No results for input(s): "VITAMINB12", "FOLATE", "FERRITIN", "TIBC", "IRON", "RETICCTPCT" in the last 72 hours. Sepsis Labs: No results for input(s): "PROCALCITON", "LATICACIDVEN" in the last 168 hours.  No results found for this or any previous visit (from the past 240 hours).       Radiology Studies: No results found.      Scheduled Meds:  amLODipine  5 mg Oral BID   aspirin EC  81 mg Oral Daily   heparin   5,000 Units Subcutaneous Q8H   hydroxychloroquine  200 mg Oral Daily   levothyroxine  137 mcg Oral QAC breakfast   metoprolol tartrate  25 mg Oral BID   Continuous Infusions:  0.9 % NaCl with KCl 20 mEq / L 75 mL/hr at 03/24/24 0915     LOS: 0 days    Time spent: 52 minutes spent on 03/24/2024 caring for this patient face-to-face including chart review, ordering labs/tests, documenting, discussion with nursing staff, consultants, updating family and interview/physical exam    Alvira Philips [place], DO [facility] Available via Epic secure chat 7am-7pm After these hours, please refer to coverage provider listed on [email or web address] 03/24/2024, 2:03 PM

## 2024-03-24 NOTE — Plan of Care (Signed)

## 2024-03-24 NOTE — Progress Notes (Signed)
 Mobility Specialist - Progress Note   03/24/24 1039  Mobility  Activity Ambulated independently in hallway  Level of Assistance Independent  Assistive Device None  Distance Ambulated (ft) 700 ft  Activity Response Tolerated well  Mobility Referral Yes  Mobility visit 1 Mobility  Mobility Specialist Start Time (ACUTE ONLY) 1026  Mobility Specialist Stop Time (ACUTE ONLY) 1036  Mobility Specialist Time Calculation (min) (ACUTE ONLY) 10 min   Pt received in bed and agreeable to mobility. No complaints during session. Pt to bed after session with all needs met.    [facility]

## 2024-03-24 NOTE — Care Management Obs Status (Signed)
 MEDICARE OBSERVATION STATUS NOTIFICATION   Patient Details  Name: Malik Beck MRN: [id number] Date of Birth: 04/22/1946   Medicare Observation Status Notification Given:  Yes    Jessie Foot, RN 03/24/2024, 11:48 AM

## 2024-03-25 LAB — COMPREHENSIVE METABOLIC PANEL WITH GFR
ALT: 21 U/L (ref 0–44)
AST: 33 U/L (ref 15–41)
Albumin: 3.1 g/dL — ABNORMAL LOW (ref 3.5–5.0)
Alkaline Phosphatase: 53 U/L (ref 38–126)
Anion gap: 6 (ref 5–15)
BUN: 24 mg/dL — ABNORMAL HIGH (ref 8–23)
CO2: 26 mmol/L (ref 22–32)
Calcium: 11.2 mg/dL — ABNORMAL HIGH (ref 8.9–10.3)
Chloride: 105 mmol/L (ref 98–111)
Creatinine, Ser: 1.59 mg/dL — ABNORMAL HIGH (ref 0.61–1.24)
GFR, Estimated: 44 mL/min — ABNORMAL LOW (ref >60)
Glucose, Bld: 89 mg/dL (ref 70–99)
Potassium: 4 mmol/L (ref 3.5–5.1)
Sodium: 137 mmol/L (ref 135–145)
Total Bilirubin: 0.6 mg/dL (ref 0.0–1.2)
Total Protein: 6.7 g/dL (ref 6.5–8.1)

## 2024-03-25 LAB — T3, FREE: T3, Free: 2.3 pg/mL (ref 2.0–4.4)

## 2024-03-25 LAB — ANGIOTENSIN CONVERTING ENZYME: Angiotensin-Converting Enzyme: 51 U/L (ref 14–82)

## 2024-03-25 LAB — MAGNESIUM: Magnesium: 2 mg/dL (ref 1.7–2.4)

## 2024-03-25 LAB — PHOSPHORUS: Phosphorus: 3.5 mg/dL (ref 2.5–4.6)

## 2024-03-25 MED ORDER — POTASSIUM CHLORIDE IN NACL 20-0.9 MEQ/L-% IV SOLN
INTRAVENOUS | Status: DC
  Filled 2024-03-25: qty 1000

## 2024-03-25 MED ORDER — AMLODIPINE BESYLATE 5 MG PO TABS: 5.0000 mg | ORAL_TABLET | Freq: Two times a day (BID) | ORAL | Status: AC

## 2024-03-25 NOTE — Discharge Summary (Signed)
 Physician Discharge Summary  Malik Beck:[id number] DOB: Sep 04, 1946 DOA: 03/23/2024  PCP: Emilio Aspen, MD  Admit date: 03/23/2024 Discharge date: 03/25/2024  Admitted From: Home Disposition: Home  Recommendations for Outpatient Follow-up:  Follow up with PCP in 1-2 weeks Recommend follow-up with endocrinology, [name] for hypercalcemia and TFT abnormalities Follow-up Free T3, PTHrP, thyroid antibodies that are pending at time of discharge Please obtain BMP in one week to reassess renal function, calcium level  Home Health: No Equipment/Devices: None  Discharge Condition: Stable CODE STATUS: Full code Diet recommendation: Heart healthy diet  History of present illness:  Malik Beck is a 78 y.o. male with past medical history significant for HTN, hypothyroidism, paroxysmal atrial fibrillation, rheumatoid arthritis, history of remote prostate cancer who presented to [facility] ED on 03/23/2024 by direction of his PCP for abnormal labs.  Patient reports has been feeling "weak" for several weeks.  Recently seen PCP with labs and increased his levothyroxine given his thyroid numbers were "off".  Labs notable for elevated calcium level and was sent to the ED for further evaluation and workup.  Patient denies any pain, no chest pain, no palpitations, no shortness of breath, no abdominal pain, no weight loss, no nausea/vomiting, no fevers, no urinary symptoms.   In the ED, temperature 97.6 F, HR 86, RR 18, BP 177/101, SpO2 98% on room air.  WBC 12.5, hemoglobin 13.8, platelet count 318.  Sodium 137, potassium 3.2, chloride 101, CO2 28, glucose 92, BUN 23, creatinine 1.58 (baseline 0.86 on 02/18/2024).  Calcium 13.2.  Ionized calcium is 7.1.  AST 50, ALT 23, total bilirubin 0.6.  Patient was given 1 L NS bolus.  EDP consulted [facility] for admission for further evaluation management of hypercalcemia, acute renal failure.  Hospital course:  Acute renal failure Patient  presenting with an elevated creatinine of 1.58, baseline creatinine 0.86 on February 18, 2024.  Suspect volume depletion versus ATN secondary to accelerated hypertension on admission.  Previously on ARB but has been subsequently discontinued by PCP.  Supported with IV fluids, creatinine remained relatively stable 1.59 at time of discharge.  Valsartan has been discontinued.  Recommend repeat BMP in 1-2 weeks now that blood pressure has improved.  If persistently elevated, consider further imaging with CT abdomen/pelvis versus renal ultrasound and consideration of outpatient follow-up with nephrology.  Hypercalcemia Patient presenting with a calcium level of 13.2, ionized calcium 7.1.  Intact PTH low at 6.  Unclear etiology, differential diagnosis includes primary hyperparathyroidism, malignancy, vitamin D intoxication (vitamin d level normal), thiazide diuretic (patient reports now on this type of medication), milk-alkali syndrome (denies excessive calcium intake), hyperthyroidism, sarcoidosis (ACE wnL), versus dehydration.  Patient was supported with IV fluid hydration.  Thyroid ultrasound with no nodules noted. Low PTH, which would be more prevalent with low calcium. TSH elevated 7.384, free T4 elevated 1.41; also with recent increase in levothyroxine outpatient. PTHrP, thyroid antibodies, and free T3 pending at time of discharge.  Would benefit from endocrinology referral outpatient, [name].  Calcium improved to 11.2 at time of discharge.  Recommend repeat BMP 1 week.   Hypothyroidism Patient with recent increase of levothyroxine to 137 mcg p.o. daily by PCP for thyroid studies being "off".  TFTs elevated with  TSH 7.384, free T4 1.41. Free T3, thyroid antibodies pending at time of discharge. Endocrinology outpatient referral, [name]   Hypokalemia Repleted.  Potassium 4.0 Thomson discharge.   Essential hypertension, poorly controlled Paroxysmal atrial fibrillation Follows with cardiology outpatient,  [name]; last seen on 03/11/2024.  Patient had single occurrence with recent hospitalization for pneumonia/influenza, thought to be secondary to stress.  Cardiology has recommended against anticoagulation at this time.  Patient was restarted on metoprolol tartrate 25 mg p.o. twice daily, amlodipine 5 mg p.o. twice daily with better control of blood pressure.  Valsartan discontinued.  Outpatient follow-up PCP/cardiology.   Rheumatoid arthritis Hydroxychloroquine 20 mg p.o. daily, Outpatient follow with rheumatology   History of remote prostate cancer Follows with urology outpatient, Dr. [name].  PET scan 2022 with no active disease process.  Continue outpatient follow-up.  Discharge Diagnoses:  Principal Problem:   Hypercalcemia    Discharge Instructions  Discharge Instructions     Call MD for:  difficulty breathing, headache or visual disturbances   Complete by: As directed    Call MD for:  extreme fatigue   Complete by: As directed    Call MD for:  persistant dizziness or light-headedness   Complete by: As directed    Call MD for:  persistant nausea and vomiting   Complete by: As directed    Call MD for:  redness, tenderness, or signs of infection (pain, swelling, redness, odor or green/yellow discharge around incision site)   Complete by: As directed    Call MD for:  temperature >100.4   Complete by: As directed    Diet - low sodium heart healthy   Complete by: As directed    Increase activity slowly   Complete by: As directed       Allergies as of 03/25/2024   No Known Allergies      Medication List     STOP taking these medications    valsartan 160 MG tablet Commonly known as: DIOVAN       TAKE these medications    amLODipine 5 MG tablet Commonly known as: NORVASC Take 1 tablet (5 mg total) by mouth in the morning and at bedtime.   aspirin 81 MG tablet Take 81 mg by mouth daily.   hydroxychloroquine 200 MG tablet Commonly known as: PLAQUENIL Take  200 mg by mouth daily.   ibuprofen 200 MG tablet Commonly known as: ADVIL Take 400 mg by mouth daily as needed for fever, headache or mild pain (pain score 1-3).   levothyroxine 137 MCG tablet Commonly known as: SYNTHROID Take 137 mcg by mouth daily before breakfast. What changed: Another medication with the same name was removed. Continue taking this medication, and follow the directions you see here.   metoprolol tartrate 25 MG tablet Commonly known as: LOPRESSOR Take 1 tablet (25 mg total) by mouth 2 (two) times daily.   MULTIVITAMIN PO Take 1 tablet by mouth daily.        Follow-up Information     Emilio Aspen, MD. Schedule an appointment as soon as possible for a visit in 1 week(s).   Specialty: Internal Medicine Contact information: [address] [place] [address] [phone]         [place], Peter M, MD Follow up.   Specialty: Cardiology Why: As needed Contact information: [address] [place] [address] [phone]         Talmage Coin, MD. Schedule an appointment as soon as possible for a visit.   Specialty: Endocrinology Contact information: [address] [place] [address] [phone]                No Known Allergies  Consultations: None  Procedures/Studies: US THYROID Result Date: 03/29/2024 CLINICAL DATA:  Hyperthyroidism EXAM: THYROID ULTRASOUND TECHNIQUE: Ultrasound examination of the thyroid gland and adjacent soft tissues was performed. COMPARISON:  None available. FINDINGS: Parenchymal Echotexture: Moderately heterogenous Isthmus: 0.2 cm Right lobe: 2.8 x 1.1 x 0.9 cm Left lobe: 2.6 x 1.0 x 1.0 cm _________________________________________________________ Estimated total number of nodules >/= 1 cm: 0 Number of spongiform nodules >/=  2 cm not described below (TR1): 0 Number of mixed cystic and solid nodules >/= 1.5 cm not described below (TR2): 0  _________________________________________________________ No discrete nodules are seen within the thyroid gland. IMPRESSION: Heterogeneous shrunken thyroid without discrete nodule. The above is in keeping with the ACR TI-RADS recommendations - J Am Coll Radiol 2017;14:587-595. Electronically Signed   By: Acquanetta Belling M.D.   On: 03-29-24 07:40     Subjective: Patient seen examined bedside, sitting in bedside chair.  Spouse present.  Slept well overnight.  No complaints.  Discussed calcium level improved with IV fluids.  Blood pressure better controlled.  Pending further thyroid studies.  Discussed recommendation of outpatient referral to endocrinology, [name].  No other specific questions or concerns at this time.  Denies headache, no dizziness, no chest pain, no palpitations, no shortness of breath, no fever/chills/night sweats, no nausea/vomiting/diarrhea, no focal weakness, no cough/congestion, no abdominal pain, no paresthesias.  No acute events overnight per nursing staff.  Discharge Exam: Vitals:   03/24/24 1941 03/29/24 0627  BP: (!) 166/86 138/81  Pulse: 62 64  Resp: 18 18  Temp: (!) 97.3 F (36.3 C) (!) 97.5 F (36.4 C)  SpO2: 99% 98%   Vitals:   03/24/24 0309 03/24/24 1321 03/24/24 1941 03/29/24 0627  BP: (!) 167/84 (!) 144/90 (!) 166/86 138/81  Pulse: 71 78 62 64  Resp: 18 18 18 18   Temp: 97.9 F (36.6 C) 97.7 F (36.5 C) (!) 97.3 F (36.3 C) (!) 97.5 F (36.4 C)  TempSrc:   Oral Oral  SpO2: 98% 98% 99% 98%  Weight:      Height:        Physical Exam: GEN: NAD, alert and oriented x 3, wd/wn HEENT: NCAT, PERRL, EOMI, sclera clear, MMM PULM: CTAB w/o wheezes/crackles, normal respiratory effort, on room air CV: RRR w/o M/G/R GI: abd soft, NTND, NABS, no R/G/M MSK: no peripheral edema, muscle strength globally intact 5/5 bilateral upper/lower extremities NEURO: CN II-XII intact, no focal deficits, sensation to light touch intact PSYCH: normal  mood/affect Integumentary: dry/intact, no rashes or wounds    The results of significant diagnostics from this hospitalization (including imaging, microbiology, ancillary and laboratory) are listed below for reference.     Microbiology: No results found for this or any previous visit (from the past 240 hours).   Labs: BNP (last 3 results) No results for input(s): "BNP" in the last 8760 hours. Basic Metabolic Panel: Recent Labs  Lab 03/23/24 1109 03/23/24 1330 03/24/24 0400 March 29, 2024 0705  NA 137  --  139 137  K 3.2*  --  3.6 4.0  CL 101  --  105 105  CO2 28  --  25 26  GLUCOSE 92  --  79 89  BUN 23  --  20 24*  CREATININE 1.58*  --  1.46* 1.59*  CALCIUM 13.2*  --  11.3* 11.2*  MG  --  1.8  --  2.0  PHOS 4.0  --   --  3.5   Liver Function Tests: Recent Labs  Lab 03/23/24 1109 03/29/2024 0705  AST 50* 33  ALT 23 21  ALKPHOS 60 53  BILITOT 0.6 0.6  PROT 7.4 6.7  ALBUMIN 3.5 3.1*   No results for input(s): "LIPASE", "AMYLASE" in the last 168 hours. No results for input(s): "AMMONIA" in the last 168 hours. CBC: Recent Labs  Lab 03/23/24 1109 03/24/24 0400  WBC 12.5* 7.1  HGB 13.8 12.5*  HCT 42.5 38.8*  MCV 92.0 93.3  PLT 318 314   Cardiac Enzymes: No results for input(s): "CKTOTAL", "CKMB", "CKMBINDEX", "TROPONINI" in the last 168 hours. BNP: Invalid input(s): "POCBNP" CBG: Recent Labs  Lab 03/23/24 1237  GLUCAP 73   D-Dimer No results for input(s): "DDIMER" in the last 72 hours. Hgb A1c No results for input(s): "HGBA1C" in the last 72 hours. Lipid Profile No results for input(s): "CHOL", "HDL", "LDLCALC", "TRIG", "CHOLHDL", "LDLDIRECT" in the last 72 hours. Thyroid function studies Recent Labs    03/23/24 1330  TSH 7.384*   Anemia work up No results for input(s): "VITAMINB12", "FOLATE", "FERRITIN", "TIBC", "IRON", "RETICCTPCT" in the last 72 hours. Urinalysis    Component Value Date/Time   COLORURINE STRAW (A) 03/23/2024 1303    APPEARANCEUR CLEAR 03/23/2024 1303   LABSPEC 1.003 (L) 03/23/2024 1303   PHURINE 7.0 03/23/2024 1303   GLUCOSEU NEGATIVE 03/23/2024 1303   HGBUR NEGATIVE 03/23/2024 1303   BILIRUBINUR NEGATIVE 03/23/2024 1303   KETONESUR NEGATIVE 03/23/2024 1303   PROTEINUR NEGATIVE 03/23/2024 1303   NITRITE NEGATIVE 03/23/2024 1303   LEUKOCYTESUR NEGATIVE 03/23/2024 1303   Sepsis Labs Recent Labs  Lab 03/23/24 1109 03/24/24 0400  WBC 12.5* 7.1   Microbiology No results found for this or any previous visit (from the past 240 hours).   Time coordinating discharge: Over 30 minutes  SIGNED:   Alvira Philips [place], DO  [facility] 03/25/2024, 9:33 AM.

## 2024-03-25 NOTE — TOC Transition Note (Signed)
 Transition of Care [facility]) - Discharge Note   Patient Details  Name: Malik Beck MRN: [id number] Date of Birth: 04/05/1946  Transition of Care [facility] CM/SW Contact:  Jessie Foot, RN Phone Number: 03/25/2024, 10:49 AM   Clinical Narrative:    No TOC needs identified. NCM signing off   Final next level of care: Home/Self Care Barriers to Discharge: Barriers Resolved   Patient Goals and CMS Choice Patient states their goals for this hospitalization and ongoing recovery are:: Home with spouse          Discharge Placement                       Discharge Plan and Services Additional resources added to the After Visit Summary for     Discharge Planning Services: CM Consult Post Acute Care Choice: NA          DME Arranged: N/A DME Agency: NA       HH Arranged: NA HH Agency: NA        Social Drivers of Health (SDOH) Interventions SDOH Screenings   Food Insecurity: No Food Insecurity (03/23/2024)  Housing: Low Risk  (03/24/2024)  Recent Concern: Housing - High Risk (03/23/2024)  Transportation Needs: No Transportation Needs (03/23/2024)  Utilities: Not At Risk (03/23/2024)  Social Connections: Patient Declined (03/23/2024)  Tobacco Use: Low Risk  (03/23/2024)     Readmission Risk Interventions     No data to display

## 2024-03-25 NOTE — Plan of Care (Signed)
  Problem: Education: Goal: Knowledge of General Education information will improve Description: Including pain rating scale, medication(s)/side effects and non-pharmacologic comfort measures Outcome: Completed/Met   Problem: Health Behavior/Discharge Planning: Goal: Ability to manage health-related needs will improve Outcome: Completed/Met   Problem: Clinical Measurements: Goal: Ability to maintain clinical measurements within normal limits will improve Outcome: Completed/Met Goal: Will remain free from infection Outcome: Completed/Met Goal: Diagnostic test results will improve Outcome: Completed/Met Goal: Respiratory complications will improve Outcome: Completed/Met Goal: Cardiovascular complication will be avoided Outcome: Completed/Met   Problem: Activity: Goal: Risk for activity intolerance will decrease Outcome: Completed/Met   Problem: Nutrition: Goal: Adequate nutrition will be maintained Outcome: Completed/Met   Problem: Coping: Goal: Level of anxiety will decrease Outcome: Completed/Met   Problem: Elimination: Goal: Will not experience complications related to bowel motility Outcome: Completed/Met Goal: Will not experience complications related to urinary retention Outcome: Completed/Met   Problem: Pain Managment: Goal: General experience of comfort will improve and/or be controlled Outcome: Completed/Met   Problem: Safety: Goal: Ability to remain free from injury will improve Outcome: Completed/Met   Problem: Skin Integrity: Goal: Risk for impaired skin integrity will decrease Outcome: Completed/Met  Pt being discharged home with family.

## 2024-03-25 NOTE — Progress Notes (Signed)
 AVS reviewed w/ pt who verbalized an understanding. PIV removed as noted. No other questions at this time. Pr dressing for d/c to home

## 2024-03-27 LAB — THYROID ANTIBODIES (THYROPEROXIDASE & THYROGLOBULIN)
Thyroglobulin Antibody: 14.7 [IU]/mL — ABNORMAL HIGH (ref 0.0–0.9)
Thyroperoxidase Ab SerPl-aCnc: 20 [IU]/mL (ref 0–34)

## 2024-04-01 LAB — PTH-RELATED PEPTIDE: PTH-related peptide: <2 pmol/L

## 2024-04-06 DIAGNOSIS — I10 Essential (primary) hypertension: Secondary | ICD-10-CM | POA: Diagnosis not present

## 2024-04-13 ENCOUNTER — Ambulatory Visit (HOSPITAL_COMMUNITY): Attending: Cardiovascular Disease

## 2024-04-13 DIAGNOSIS — I480 Paroxysmal atrial fibrillation: Secondary | ICD-10-CM | POA: Diagnosis not present

## 2024-04-13 DIAGNOSIS — I5030 Unspecified diastolic (congestive) heart failure: Secondary | ICD-10-CM

## 2024-04-13 DIAGNOSIS — I10 Essential (primary) hypertension: Secondary | ICD-10-CM | POA: Insufficient documentation

## 2024-04-13 DIAGNOSIS — I083 Combined rheumatic disorders of mitral, aortic and tricuspid valves: Secondary | ICD-10-CM

## 2024-04-13 LAB — ECHOCARDIOGRAM COMPLETE
Area-P 1/2: 3.56 cm2
S' Lateral: 2.5 cm

## 2024-04-15 ENCOUNTER — Other Ambulatory Visit: Payer: Self-pay | Admitting: Internal Medicine

## 2024-04-15 DIAGNOSIS — N179 Acute kidney failure, unspecified: Secondary | ICD-10-CM

## 2024-04-18 ENCOUNTER — Other Ambulatory Visit (HOSPITAL_COMMUNITY): Payer: Self-pay | Admitting: Internal Medicine

## 2024-04-18 DIAGNOSIS — Z8572 Personal history of non-Hodgkin lymphomas: Secondary | ICD-10-CM

## 2024-04-25 ENCOUNTER — Ambulatory Visit
Admission: RE | Admit: 2024-04-25 | Discharge: 2024-04-25 | Disposition: A | Discharge status: Home / Self Care | Source: Ambulatory Visit | Attending: Internal Medicine | Admitting: Internal Medicine

## 2024-04-25 DIAGNOSIS — N179 Acute kidney failure, unspecified: Secondary | ICD-10-CM

## 2024-04-25 DIAGNOSIS — N261 Atrophy of kidney (terminal): Secondary | ICD-10-CM | POA: Diagnosis not present

## 2024-04-25 DIAGNOSIS — Z8546 Personal history of malignant neoplasm of prostate: Secondary | ICD-10-CM | POA: Diagnosis not present

## 2024-04-27 ENCOUNTER — Encounter (HOSPITAL_COMMUNITY): Payer: Self-pay

## 2024-04-27 ENCOUNTER — Ambulatory Visit (HOSPITAL_COMMUNITY)
Admission: RE | Admit: 2024-04-27 | Discharge: 2024-04-27 | Disposition: A | Discharge status: Home / Self Care | Source: Ambulatory Visit | Attending: Internal Medicine | Admitting: Internal Medicine

## 2024-04-27 ENCOUNTER — Ambulatory Visit (HOSPITAL_COMMUNITY)

## 2024-04-27 DIAGNOSIS — K7689 Other specified diseases of liver: Secondary | ICD-10-CM | POA: Diagnosis not present

## 2024-04-27 DIAGNOSIS — Z8572 Personal history of non-Hodgkin lymphomas: Secondary | ICD-10-CM | POA: Diagnosis not present

## 2024-04-27 DIAGNOSIS — S2241XA Multiple fractures of ribs, right side, initial encounter for closed fracture: Secondary | ICD-10-CM | POA: Diagnosis not present

## 2024-04-27 DIAGNOSIS — C61 Malignant neoplasm of prostate: Secondary | ICD-10-CM | POA: Diagnosis not present

## 2024-04-27 MED ORDER — IOHEXOL 9 MG/ML PO SOLN: 500.0000 mL | ORAL | Status: AC

## 2024-04-27 MED ORDER — IOHEXOL 300 MG/ML  SOLN
80.0000 mL | Freq: Once | INTRAMUSCULAR | Status: AC | PRN
  Administered 2024-04-27: 80 mL via INTRAVENOUS

## 2024-04-29 DIAGNOSIS — D472 Monoclonal gammopathy: Secondary | ICD-10-CM | POA: Diagnosis not present

## 2024-04-29 DIAGNOSIS — R946 Abnormal results of thyroid function studies: Secondary | ICD-10-CM | POA: Diagnosis not present

## 2024-05-03 DIAGNOSIS — D472 Monoclonal gammopathy: Secondary | ICD-10-CM | POA: Diagnosis not present

## 2024-05-03 DIAGNOSIS — Z8546 Personal history of malignant neoplasm of prostate: Secondary | ICD-10-CM | POA: Diagnosis not present

## 2024-05-03 DIAGNOSIS — Z8572 Personal history of non-Hodgkin lymphomas: Secondary | ICD-10-CM | POA: Diagnosis not present

## 2024-05-10 ENCOUNTER — Encounter: Payer: Self-pay | Admitting: Internal Medicine

## 2024-05-11 ENCOUNTER — Inpatient Hospital Stay: Attending: Hematology and Oncology

## 2024-05-11 ENCOUNTER — Inpatient Hospital Stay (HOSPITAL_BASED_OUTPATIENT_CLINIC_OR_DEPARTMENT_OTHER): Admitting: Hematology and Oncology

## 2024-05-11 VITALS — BP 178/80 | HR 73 | Temp 97.2°F | Resp 14 | Wt 132.4 lb

## 2024-05-11 DIAGNOSIS — M89259 Other disorders of bone development and growth, unspecified femur: Secondary | ICD-10-CM | POA: Insufficient documentation

## 2024-05-11 DIAGNOSIS — Z79890 Hormone replacement therapy (postmenopausal): Secondary | ICD-10-CM | POA: Insufficient documentation

## 2024-05-11 DIAGNOSIS — Z9079 Acquired absence of other genital organ(s): Secondary | ICD-10-CM | POA: Diagnosis not present

## 2024-05-11 DIAGNOSIS — Z8546 Personal history of malignant neoplasm of prostate: Secondary | ICD-10-CM | POA: Insufficient documentation

## 2024-05-11 DIAGNOSIS — R779 Abnormality of plasma protein, unspecified: Secondary | ICD-10-CM

## 2024-05-11 DIAGNOSIS — Z7982 Long term (current) use of aspirin: Secondary | ICD-10-CM | POA: Insufficient documentation

## 2024-05-11 DIAGNOSIS — Z8042 Family history of malignant neoplasm of prostate: Secondary | ICD-10-CM | POA: Insufficient documentation

## 2024-05-11 DIAGNOSIS — Z85828 Personal history of other malignant neoplasm of skin: Secondary | ICD-10-CM | POA: Diagnosis not present

## 2024-05-11 DIAGNOSIS — D472 Monoclonal gammopathy: Secondary | ICD-10-CM

## 2024-05-11 DIAGNOSIS — E039 Hypothyroidism, unspecified: Secondary | ICD-10-CM | POA: Diagnosis not present

## 2024-05-11 DIAGNOSIS — Z8249 Family history of ischemic heart disease and other diseases of the circulatory system: Secondary | ICD-10-CM | POA: Diagnosis not present

## 2024-05-11 LAB — CBC WITH DIFFERENTIAL (CANCER CENTER ONLY)
Abs Immature Granulocytes: 0.01 10*3/uL (ref 0.00–0.07)
Basophils Absolute: 0 10*3/uL (ref 0.0–0.1)
Basophils Relative: 0 %
Eosinophils Absolute: 0.1 10*3/uL (ref 0.0–0.5)
Eosinophils Relative: 1 %
HCT: 40.4 % (ref 39.0–52.0)
Hemoglobin: 13.3 g/dL (ref 13.0–17.0)
Immature Granulocytes: 0 %
Lymphocytes Relative: 8 %
Lymphs Abs: 0.6 10*3/uL — ABNORMAL LOW (ref 0.7–4.0)
MCH: 30 pg (ref 26.0–34.0)
MCHC: 32.9 g/dL (ref 30.0–36.0)
MCV: 91 fL (ref 80.0–100.0)
Monocytes Absolute: 0.3 10*3/uL (ref 0.1–1.0)
Monocytes Relative: 4 %
Neutro Abs: 5.8 10*3/uL (ref 1.7–7.7)
Neutrophils Relative %: 87 %
Platelet Count: 181 10*3/uL (ref 150–400)
RBC: 4.44 MIL/uL (ref 4.22–5.81)
RDW: 14.4 % (ref 11.5–15.5)
WBC Count: 6.7 10*3/uL (ref 4.0–10.5)
nRBC: 0 % (ref 0.0–0.2)

## 2024-05-11 LAB — CMP (CANCER CENTER ONLY)
ALT: 12 U/L (ref 0–44)
AST: 23 U/L (ref 15–41)
Albumin: 4.8 g/dL (ref 3.5–5.0)
Alkaline Phosphatase: 54 U/L (ref 38–126)
Anion gap: 8 (ref 5–15)
BUN: 19 mg/dL (ref 8–23)
CO2: 26 mmol/L (ref 22–32)
Calcium: 10.1 mg/dL (ref 8.9–10.3)
Chloride: 103 mmol/L (ref 98–111)
Creatinine: 1.13 mg/dL (ref 0.61–1.24)
GFR, Estimated: >60 mL/min (ref >60)
Glucose, Bld: 106 mg/dL — ABNORMAL HIGH (ref 70–99)
Potassium: 4.5 mmol/L (ref 3.5–5.1)
Sodium: 137 mmol/L (ref 135–145)
Total Bilirubin: 0.4 mg/dL (ref 0.0–1.2)
Total Protein: 8.3 g/dL — ABNORMAL HIGH (ref 6.5–8.1)

## 2024-05-11 LAB — LACTATE DEHYDROGENASE: LDH: 164 U/L (ref 98–192)

## 2024-05-11 NOTE — Progress Notes (Signed)
 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  05/10/2024: Bone survey showed 10 mm lucent lesion within the mid right femoral shaft.  CHIEF COMPLAINTS/PURPOSE OF CONSULTATION:  "Abnormal SPEP "  HISTORY OF PRESENTING ILLNESS:  Malik Beck 78 y.o. male with medical history significant for prostate cancer status post resection in 2009, hypothyroidism, and asthma who presents for evaluation of an abnormal SPEP.  On review of the previous records Malik Beck had an SPEP ordered on 04/11/2024 which showed an M protein of 0.5.  Underwent a bone survey on 05/10/2024 which showed a 10 mm lucent lesion within the mid right femoral shaft.  Due to concern for his elevated M protein and lucent lesion he was referred to hematology for further evaluation and management.  On exam today Malik Beck reports that this all began he developed flu and pneumonia" his labs were off".  He reports his thyroid  was also out of range after having been perfect for years.  He was thought to be quite sick at that time.  He did undergo a CT scanning of the chest abdomen pelvis on 04/27/2024 which did show no clear reason for his hypercalcemia.  He subsequently underwent a bone survey which showed a 10 mm lucent lesion in the right femoral shaft.  He notes he is not currently having any bone or back pain.  On further discussion he reports there is no family history of cancer.  His sister is healthy and his mother had transverse myelitis.  His father passed away of congestive heart failure.  He has 1 son who is 107 years old and 4 grandchildren.  He notes he is a never smoker and would drink about 2-3 beers per year.  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  He also served in the [employer] for 6 years.  He does his best to try to exercise daily and enjoys bicycling.  He reports that he drinks a lot of water and Gatorade or to stay hydrated and avoid sodas.  He otherwise denies any fevers, chills, sweats, nausea vomiting or diarrhea.  Full 10 point ROS is otherwise negative.  MEDICAL HISTORY:  Past Medical History:  Diagnosis Date   Arthritis    1 finger   Asthma    childhood asthma none now   Cancer (HCC)    nhl, prostate ca   Hypothyroidism    Prostate cancer [facility]) 2009   Skin cancer April 2012    SURGICAL HISTORY: Past Surgical History:  Procedure Laterality Date   big toe surgery  yrs ago   COLONOSCOPY WITH PROPOFOL  N/A 05/04/2017   Procedure: COLONOSCOPY WITH PROPOFOL ;  Surgeon: Garrett Kallman, MD;  Location: [facility];  Service: Endoscopy;  Laterality: N/A;   colonscopy  yrs ago   NASAL SEPTUM SURGERY  yrs ago   XI ROBOTIC ASSISTED SIMPLE PROSTATECTOMY  2009    SOCIAL HISTORY: Social History   Socioeconomic History   Marital status: Married    Spouse name: Not on file   Number of children: Not on file   Years of education: Not on file   Highest education level: Not on file  Occupational History   Not on file  Tobacco Use   Smoking status: Never   Smokeless tobacco: Never  Vaping Use   Vaping status: Never Used  Substance and Sexual Activity   Alcohol use: Yes    Alcohol/week: 1.0 standard drink of alcohol    Types: 1 Glasses of wine per week    Comment: seldom   Drug use: Never   Sexual activity: Yes  Other Topics Concern   Not on file  Social History Narrative   ** Merged History Encounter **       [place] in [place]. Very physically active, enjoys cycling.   Social Drivers of Corporate investment banker Strain: Not on file  Food Insecurity: No Food Insecurity (05/11/2024)   Hunger Vital Sign    Worried About  Running Out of Food in the Last Year: Never true    Ran Out of Food in the Last Year: Never true  Transportation Needs: No Transportation Needs (05/11/2024)   PRAPARE - Administrator, Civil Service (Medical): No    Lack of Transportation (Non-Medical): No  Physical Activity: Not on file  Stress: Not on file  Social Connections: Patient Declined (03/23/2024)   Social Connection and Isolation Panel [NHANES]    Frequency of Communication with Friends and Family: Patient declined    Frequency of Social Gatherings with Friends and Family: Patient declined    Attends Religious Services: Patient declined    Database administrator or Organizations: Patient declined    Attends Banker Meetings: Patient declined    Marital Status: Patient declined  Intimate Partner Violence: Not At Risk (05/11/2024)   Humiliation, Afraid, Rape, and Kick questionnaire    Fear of Current or Ex-Partner: No    Emotionally Abused: No    Physically Abused: No    Sexually Abused: No    FAMILY HISTORY: Family History  Problem Relation Age of Onset   Prostate cancer Father    Breast cancer Neg Hx    Colon cancer Neg Hx    Pancreatic cancer Neg Hx     ALLERGIES:  has no known allergies.  MEDICATIONS:  Current Outpatient Medications  Medication Sig Dispense Refill   amLODipine  (NORVASC ) 5 MG tablet Take 1 tablet (5 mg total) by mouth in the morning and at bedtime.     aspirin  81 MG tablet Take 81 mg by mouth daily.     hydroxychloroquine  (PLAQUENIL ) 200 MG tablet Take 200 mg by mouth daily.     ibuprofen (ADVIL) 200 MG tablet Take 400 mg by mouth daily as needed for fever, headache or mild pain (pain score 1-3).     levothyroxine  (SYNTHROID ) 137 MCG tablet Take 137 mcg by mouth daily before breakfast.     Multiple Vitamins-Minerals (MULTIVITAMIN PO) Take 1 tablet by mouth daily.      No current facility-administered medications for this visit.    REVIEW OF SYSTEMS:   Constitutional: (  - ) fevers, ( - )  chills , ( - ) night sweats Eyes: ( - ) blurriness of vision, ( - ) double vision, ( - ) watery eyes Ears, nose, mouth, throat, and face: ( - ) mucositis, ( - ) sore throat Respiratory: ( - ) cough, ( - ) dyspnea, ( - ) wheezes Cardiovascular: ( - ) palpitation, ( - ) chest discomfort, ( - ) lower extremity swelling Gastrointestinal:  ( - ) nausea, ( - ) heartburn, ( - ) change in bowel habits Skin: ( - ) abnormal skin rashes  Lymphatics: ( - ) new lymphadenopathy, ( - ) easy bruising Neurological: ( - ) numbness, ( - ) tingling, ( - ) new weaknesses Behavioral/Psych: ( - ) mood change, ( - ) new changes  All other systems were reviewed with the patient and are negative.  PHYSICAL EXAMINATION:  Vitals:   05/11/24 1317  BP: (!) 178/80  Pulse: 73  Resp: 14  Temp: (!) 97.2 F (36.2 C)  SpO2: 100%   Filed Weights   05/11/24 1317  Weight: 132 lb 6.4 oz (60.1 kg)    GENERAL: well appearing elderly Caucasian male in NAD  SKIN: skin color, texture, turgor are normal, no rashes or significant lesions EYES: conjunctiva are pink and non-injected, sclera clear LUNGS: clear to auscultation and percussion with normal breathing effort HEART: regular rate & rhythm and no murmurs and no lower extremity edema Musculoskeletal: no cyanosis of digits and no clubbing  PSYCH: alert & oriented x 3, fluent speech NEURO: no focal motor/sensory deficits  LABORATORY DATA:  I have reviewed the data as listed    Latest Ref Rng & Units 03/24/2024    4:00 AM 03/23/2024   11:09 AM 02/18/2024   12:32 AM  CBC  WBC 4.0 - 10.5 K/uL 7.1  12.5  12.4   Hemoglobin 13.0 - 17.0 g/dL 16.1  09.6  04.5   Hematocrit 39.0 - 52.0 % 38.8  42.5  34.4   Platelets 150 - 400 K/uL 314  318  213        Latest Ref Rng & Units 03/25/2024    7:05 AM 03/24/2024    4:00 AM 03/23/2024   11:09 AM  CMP  Glucose 70 - 99 mg/dL 89  79  92   BUN 8 - 23 mg/dL 24  20  23    Creatinine 0.61 - 1.24 mg/dL 4.09  8.11  9.14    Sodium 135 - 145 mmol/L 137  139  137   Potassium 3.5 - 5.1 mmol/L 4.0  3.6  3.2   Chloride 98 - 111 mmol/L 105  105  101   CO2 22 - 32 mmol/L 26  25  28    Calcium 8.9 - 10.3 mg/dL 78.2  95.6  21.3   Total Protein 6.5 - 8.1 g/dL 6.7   7.4   Total Bilirubin 0.0 - 1.2 mg/dL 0.6   0.6   Alkaline Phos 38 - 126 U/L 53   60   AST 15 - 41 U/L 33   50   ALT 0 - 44 U/L 21   23      ASSESSMENT & PLAN Malik Beck 78 y.o. male with medical history significant for prostate cancer status post resection in 2009, hypothyroidism, and asthma who presents for evaluation of an abnormal SPEP.  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  Monoclonal Gammopathies are a group of medical conditions defined by the presence of a monoclonal protein (an M protein) in the blood or urine. Monoclonal gammopathies include monoclonal gammopathy of unknown significance (MGUS), Monoclonal gammopathies of renal or neurological significance,  smoldering multiple myeloma (SMM), multiple myeloma (MM), AL amyloidosis, and Waldenstrom macroglobulinemia. The goal of the initial workup is to determine which monoclonal gammopathy a patient has. The workup consists of evaluating protein in the serum (with serum protein electrophoresis (SPEP) and serum free light chains) , evaluating protein in the urine (UPEP), and evaluation  of the skeleton (DG Bone Met Survey) to assure no lytic lesions. Baseline bloodwork includes CMP and CBC. If no CRAB criteria or high risk criteria are noted then the diagnosis is MGUS. MGUS must be followed with bloodwork periodically to assure it does not convert to multiple myeloma (occurs to approximately 1% of patients per year). If there are CRAB criteria or high risk features (such as elevated serum free light chain ratio (taking into account renal function), a non IgG M  protein, or M protein >1.5) then a bone marrow biopsy must be pursued.    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  In order to qualify for multiple myeloma patient required 2 lytic lesions.  Can consider PET CT scan to evaluate further if lab work is ambiguous. --will consider the need for a bone marrow biopsy pending the above results --RTC in 6 months or sooner if intervention is required.    Orders Placed This Encounter  Procedures   CBC with Differential (Cancer Center Only)    Standing Status:   Future    Number of Occurrences:   1    Expiration Date:   05/11/2025   CMP (Cancer Center only)    Standing Status:   Future    Number of Occurrences:   1    Expiration Date:   05/11/2025   Lactate dehydrogenase (LDH)    Standing Status:   Future    Number of Occurrences:   1    Expiration Date:   05/11/2025   Multiple Myeloma Panel (SPEP&IFE w/QIG)    Standing Status:   Future    Number of Occurrences:   1    Expiration Date:   05/11/2025   Kappa/lambda light chains    Standing Status:   Future    Number of Occurrences:   1    Expiration Date:   05/11/2025   Beta 2 microglobulin    Standing Status:   Future    Number of Occurrences:   1    Expiration Date:   05/11/2025   24-Hr Ur UPEP/UIFE/Light Chains/TP    Standing Status:   Future    Expiration Date:   05/11/2025    All questions were answered. The patient knows to call the clinic with any problems, questions or concerns.  A total of more than 60 minutes were spent on this encounter with face-to-face time and non-face-to-face time, including preparing to see the patient, ordering tests and/or medications, counseling the patient and coordination of care as outlined above.    Rogerio Clay, MD Department of Hematology/Oncology [facility] Phone: [phone] Pager: [phone] Email: [email or web address]  05/11/2024 2:34 PM

## 2024-05-12 DIAGNOSIS — R779 Abnormality of plasma protein, unspecified: Secondary | ICD-10-CM | POA: Diagnosis not present

## 2024-05-12 DIAGNOSIS — Z8249 Family history of ischemic heart disease and other diseases of the circulatory system: Secondary | ICD-10-CM | POA: Diagnosis not present

## 2024-05-12 DIAGNOSIS — Z85828 Personal history of other malignant neoplasm of skin: Secondary | ICD-10-CM | POA: Diagnosis not present

## 2024-05-12 DIAGNOSIS — Z7982 Long term (current) use of aspirin: Secondary | ICD-10-CM | POA: Diagnosis not present

## 2024-05-12 DIAGNOSIS — Z9079 Acquired absence of other genital organ(s): Secondary | ICD-10-CM | POA: Diagnosis not present

## 2024-05-12 DIAGNOSIS — M89259 Other disorders of bone development and growth, unspecified femur: Secondary | ICD-10-CM | POA: Diagnosis not present

## 2024-05-12 DIAGNOSIS — E039 Hypothyroidism, unspecified: Secondary | ICD-10-CM | POA: Diagnosis not present

## 2024-05-12 DIAGNOSIS — Z79890 Hormone replacement therapy (postmenopausal): Secondary | ICD-10-CM | POA: Diagnosis not present

## 2024-05-12 DIAGNOSIS — Z8546 Personal history of malignant neoplasm of prostate: Secondary | ICD-10-CM | POA: Diagnosis not present

## 2024-05-12 DIAGNOSIS — Z8042 Family history of malignant neoplasm of prostate: Secondary | ICD-10-CM | POA: Diagnosis not present

## 2024-05-12 LAB — BETA 2 MICROGLOBULIN, SERUM: Beta-2 Microglobulin: 2.7 mg/L — ABNORMAL HIGH (ref 0.6–2.4)

## 2024-05-12 LAB — KAPPA/LAMBDA LIGHT CHAINS
Kappa free light chain: 43 mg/L — ABNORMAL HIGH (ref 3.3–19.4)
Kappa, lambda light chain ratio: 1.33 (ref 0.26–1.65)
Lambda free light chains: 32.3 mg/L — ABNORMAL HIGH (ref 5.7–26.3)

## 2024-05-13 LAB — MULTIPLE MYELOMA PANEL, SERUM
Albumin SerPl Elph-Mcnc: 4.1 g/dL (ref 2.9–4.4)
Albumin/Glob SerPl: 1.2 (ref 0.7–1.7)
Alpha 1: 0.3 g/dL (ref 0.0–0.4)
Alpha2 Glob SerPl Elph-Mcnc: 0.8 g/dL (ref 0.4–1.0)
B-Globulin SerPl Elph-Mcnc: 1 g/dL (ref 0.7–1.3)
Gamma Glob SerPl Elph-Mcnc: 1.6 g/dL (ref 0.4–1.8)
Globulin, Total: 3.6 g/dL (ref 2.2–3.9)
IgA: 267 mg/dL (ref 61–437)
IgG (Immunoglobin G), Serum: 1428 mg/dL (ref 603–1613)
IgM (Immunoglobulin M), Srm: 264 mg/dL — ABNORMAL HIGH (ref 15–143)
Total Protein ELP: 7.7 g/dL (ref 6.0–8.5)

## 2024-05-18 LAB — UPEP/UIFE/LIGHT CHAINS/TP, 24-HR UR
% BETA, Urine: 0 %
ALPHA 1 URINE: 0 %
Albumin, U: 100 %
Alpha 2, Urine: 0 %
Free Kappa Lt Chains,Ur: 9.16 mg/L (ref 1.17–86.46)
Free Kappa/Lambda Ratio: 3.82 (ref 1.83–14.26)
Free Lambda Lt Chains,Ur: 2.4 mg/L (ref 0.27–15.21)
GAMMA GLOBULIN URINE: 0 %
Total Protein, Urine-Ur/day: <120 mg/(24.h) (ref 30–150)
Total Protein, Urine: <4 mg/dL
Total Volume: 3000

## 2024-06-10 ENCOUNTER — Telehealth: Payer: Self-pay | Admitting: *Deleted

## 2024-06-10 NOTE — Telephone Encounter (Signed)
 Received vm message from pt inquiring about his lab results from 05/11/24.   Please advise

## 2024-06-13 ENCOUNTER — Telehealth: Payer: Self-pay | Admitting: *Deleted

## 2024-06-13 NOTE — Telephone Encounter (Signed)
 TCT patient regarding recent lab results. Advised that his workup was unremarkable. Dr. Federico did not detect any abnormal protein in his blood or urine. Additionally his calcium levels appear to have normalized. At this time there is no evidence of an underlying bone marrow or hematological disorder. There is no need for routine follow-up in our clinic. Please have him call if he develops any other new blood issues or has questions or concerns.  Pt pleased with results. No further questions or concerns.

## 2024-07-13 ENCOUNTER — Encounter: Payer: Self-pay | Admitting: Internal Medicine

## 2024-07-18 DIAGNOSIS — M25562 Pain in left knee: Secondary | ICD-10-CM | POA: Diagnosis not present

## 2024-08-01 DIAGNOSIS — C61 Malignant neoplasm of prostate: Secondary | ICD-10-CM | POA: Diagnosis not present

## 2024-08-22 DIAGNOSIS — H5203 Hypermetropia, bilateral: Secondary | ICD-10-CM | POA: Diagnosis not present

## 2024-08-22 DIAGNOSIS — H52223 Regular astigmatism, bilateral: Secondary | ICD-10-CM | POA: Diagnosis not present

## 2024-08-22 DIAGNOSIS — Z79899 Other long term (current) drug therapy: Secondary | ICD-10-CM | POA: Diagnosis not present

## 2024-08-22 DIAGNOSIS — H2513 Age-related nuclear cataract, bilateral: Secondary | ICD-10-CM | POA: Diagnosis not present

## 2024-08-22 DIAGNOSIS — H524 Presbyopia: Secondary | ICD-10-CM | POA: Diagnosis not present

## 2024-09-05 DIAGNOSIS — C61 Malignant neoplasm of prostate: Secondary | ICD-10-CM | POA: Diagnosis not present
